# Patient Record
Sex: Male | Born: 1945 | Race: White | Hispanic: No | Marital: Married | Smoking: Former smoker
Health system: Southern US, Community
[De-identification: ages and names within clinical notes are randomized; demographics above are authoritative.]

## PROBLEM LIST (undated history)

## (undated) DIAGNOSIS — D649 Anemia, unspecified: Secondary | ICD-10-CM

## (undated) DIAGNOSIS — N183 Chronic kidney disease, stage 3 (moderate): Secondary | ICD-10-CM

## (undated) DIAGNOSIS — D531 Other megaloblastic anemias, not elsewhere classified: Secondary | ICD-10-CM

## (undated) DIAGNOSIS — D580 Hereditary spherocytosis: Secondary | ICD-10-CM

## (undated) HISTORY — DX: Other megaloblastic anemias, not elsewhere classified: D53.1

## (undated) HISTORY — DX: Chronic kidney disease, stage 3 (moderate): N18.3

## (undated) HISTORY — DX: Hereditary spherocytosis: D58.0

## (undated) HISTORY — DX: Anemia, unspecified: D64.9

---

## 2001-03-01 ENCOUNTER — Inpatient Hospital Stay (HOSPITAL_COMMUNITY): Admission: AD | Admit: 2001-03-01 | Discharge: 2001-03-03 | Payer: Self-pay | Admitting: Family Medicine

## 2001-03-01 ENCOUNTER — Encounter: Payer: Self-pay | Admitting: Family Medicine

## 2001-03-02 ENCOUNTER — Encounter: Payer: Self-pay | Admitting: Family Medicine

## 2001-03-10 ENCOUNTER — Encounter: Admission: RE | Admit: 2001-03-10 | Discharge: 2001-03-10 | Payer: Self-pay | Admitting: Family Medicine

## 2001-04-01 ENCOUNTER — Encounter: Payer: Self-pay | Admitting: Family Medicine

## 2001-04-01 ENCOUNTER — Ambulatory Visit (HOSPITAL_COMMUNITY): Admission: RE | Admit: 2001-04-01 | Discharge: 2001-04-01 | Payer: Self-pay | Admitting: Family Medicine

## 2001-04-26 ENCOUNTER — Other Ambulatory Visit: Admission: RE | Admit: 2001-04-26 | Discharge: 2001-04-26 | Payer: Self-pay | Admitting: Radiology

## 2005-01-07 ENCOUNTER — Ambulatory Visit: Payer: Self-pay | Admitting: Cardiology

## 2005-01-08 ENCOUNTER — Inpatient Hospital Stay (HOSPITAL_COMMUNITY): Admission: EM | Admit: 2005-01-08 | Discharge: 2005-01-10 | Payer: Self-pay | Admitting: Emergency Medicine

## 2005-01-08 ENCOUNTER — Encounter: Payer: Self-pay | Admitting: Cardiology

## 2007-09-15 ENCOUNTER — Encounter (INDEPENDENT_AMBULATORY_CARE_PROVIDER_SITE_OTHER): Payer: Self-pay | Admitting: General Surgery

## 2007-09-15 ENCOUNTER — Ambulatory Visit (HOSPITAL_COMMUNITY): Admission: RE | Admit: 2007-09-15 | Discharge: 2007-09-16 | Payer: Self-pay | Admitting: General Surgery

## 2011-03-27 ENCOUNTER — Ambulatory Visit (HOSPITAL_COMMUNITY)
Admission: RE | Admit: 2011-03-27 | Discharge: 2011-03-27 | Disposition: A | Discharge status: Home / Self Care | Payer: Medicare Other | Source: Ambulatory Visit | Attending: Cardiology | Admitting: Cardiology

## 2011-03-27 DIAGNOSIS — I209 Angina pectoris, unspecified: Secondary | ICD-10-CM | POA: Insufficient documentation

## 2011-03-27 DIAGNOSIS — I2510 Atherosclerotic heart disease of native coronary artery without angina pectoris: Secondary | ICD-10-CM | POA: Insufficient documentation

## 2011-03-27 LAB — GLUCOSE, CAPILLARY
Glucose-Capillary: 281 mg/dL — ABNORMAL HIGH (ref 70–99)
Glucose-Capillary: 291 mg/dL — ABNORMAL HIGH (ref 70–99)
Glucose-Capillary: 321 mg/dL — ABNORMAL HIGH (ref 70–99)

## 2011-04-07 ENCOUNTER — Other Ambulatory Visit (INDEPENDENT_AMBULATORY_CARE_PROVIDER_SITE_OTHER): Payer: Medicare Other

## 2011-04-07 DIAGNOSIS — R0989 Other specified symptoms and signs involving the circulatory and respiratory systems: Secondary | ICD-10-CM

## 2011-04-08 NOTE — Op Note (Signed)
NAME:  Mario Foster, Mario Foster NO.:  [id number]   MEDICAL RECORD NO.:  [id number]          PATIENT TYPE:  OIB   LOCATION:  5735                         FACILITY:  [facility]   PHYSICIAN:  Cherylynn Ridges, M.D.    DATE OF BIRTH:  17-May-1946   DATE OF PROCEDURE:  09/15/2007  DATE OF DISCHARGE:  09/16/2007                               OPERATIVE REPORT   PREOP DIAGNOSIS:  Symptomatic cholelithiasis with a history of  choledocholithiasis.   POSTOP DIAGNOSIS:  Symptomatic cholelithiasis with a history of  choledocholithiasis.   PROCEDURE:  Laparoscopic cholecystectomy with cholangiogram.   SURGEON:  Cherylynn Ridges, M.D.   ASSISTANT:  None.   ANESTHESIA:  General endotracheal.   ESTIMATED BLOOD LOSS:  Less than 30 mL.   COMPLICATIONS:  None.   CONDITION:  Stable.   FINDINGS:  The patient had a gallbladder packed with pigmented stones.  The cholangiogram was normal.  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   INDICATIONS FOR OPERATION:  The patient is a 65 year old whom I saw my  office on August 24, 2007 with a history of symptomatic gallstones,  but with jaundice with an bilirubin up to about 19.  He was not  hospitalized, but subsequent ultrasound demonstrated multiple gallstones  in the gallbladder, and a cavernous hemangioma of the liver.  He was  brought to surgery because of continued symptoms.   OPERATION:  The patient was taken to the operating room, and placed on  the table in the supine position.  After an adequate general  endotracheal anesthetic was administered, he was prepped and draped in  the usual sterile manner exposing the midline and the right upper  quadrant.   A supraumbilical curvilinear incision was made using a #11 blade and  taken down to the midline fascia.  We were able to split the midline  fascia with the [employer] retractors and place along the midline, and  then grab the edges with a Kocher clamp.  As we pulled up on the Kocher  clamps, we  bluntly dissected down into the peritoneal cavity with Kelly  clamps.  With this being done, a pursestring suture of #0 Vicryl was  passed on the fascial opening, and then a Hassan cannula passed through  the fascial opening into the peritoneal cavity with minimal difficulty.  It was secured in place with the pursestring suture of #0 Vicryl.   Once this was done, carbon dioxide insufflation was instilled into the  peritoneal cavity up to maximal intra-abdominal pressure of 15 mmHg.  Two right upper quadrant 5 mm cannulas and a subxiphoid 12 mm cannula  was passed under direct vision.  Once they were all in place, the  patient was placed in reverse Trendelenburg and the left-side was tilted  down.   The gallbladder was tense from the beginning and impacted with stones.  We were able to grab it at the dome and retract it towards the right  upper quadrant and the anterior abdominal wall.  We then placed a second  grasper on the infundibulum, and then dissected out the peritoneum  overlying the triangle of Fallot, and  hepatoduodenal triangle.  This  allowed [place] to dissect out the cystic duct and the cystic artery both of  which were clipped and cauterized.  We clipped the cystic duct along the  gallbladder side, then made a cholecystodochotomy.  It was through this  cholecystodochotomy that a cholangiogram was performed through a Cook  catheter which had been passed through the anterior abdominal wall.  This showed good flow into the duodenum, no proximal filling defects, no  dilatation of the common duct, and normal duodenum and good proximal  flow.   As mentioned, before, we did not cauterize the cystic duct.  We only  cauterized the cystic artery, and then clipped it and transected the  cystic artery. Once the cholangiogram was completed, we removed the  clips securing the catheter in place, removed the catheter, and then  triply clipped the distal cystic duct and transected the cystic  duct.  The rest of the procedure was spent dissecting out the gallbladder from  its chronically inflamed gallbladder bed.  We obtained hemostasis with  electrocautery then retrieved the gallbladder from the supraumbilical  site, having to open the gallbladder and retrieve a large amount of very  small pigmented stones from the gallbladder as we did so.   At no point did we notice a large tumor or mass in the liver itself.  The cavernous hemangioma noted on previous CT scan, it was not noted  intraoperatively.   Once the gallbladder was removed we irrigated the gallbladder fossa with  saline, and controlled hemostasis with electrocautery.  We washed the  subfascial plane with saline to remove all pieces of stone, pigmented  stones, which were in place.  Once this was done, we aspirated all fluid  and gas from above the liver, removed all cannulas and then closed.   The supraumbilical site was closed with a pursestring suture which was  in place, and we reinforced it with a single stitch of #0 Vicryl stitch.  We then injected 1/4% Marcaine at all sites, and then closed the skin at  the subxiphoid and supraumbilical site with running subcuticular 4-0  Vicryl.  We closed the lateral cannula sites with Dermabond, and then  applied Dermabond to the skin at the subxiphoid and the supraumbilical  site.  Once this was done, Steri-Strips and Tegaderm were applied.  All  needle counts, sponge counts, and instrument counts were correct.      Cherylynn Ridges, M.D.  Electronically Signed     JOW/MEDQ  D:  09/15/2007  T:  09/16/2007  Job:  [id number]   cc:   Petra Kuba, M.D.  Windle Guard, M.D.

## 2011-04-11 NOTE — Discharge Summary (Signed)
. [facility]  Patient:    Mario Foster, Mario Foster                     MRN: [id number] Adm. Date:  [id number] Disc. Date: 03/03/01 Attending:  Sanjuana Letters Dictator:   Gwenlyn Perking, M.D. CC:         Dr. Jeannetta Nap at [facility]             Urgent [facility]             Leighton Roach. Truett Perna, M.D.                           Discharge Summary  ADMISSION DIAGNOSES: 1. Anemia. 2. Jaundice. 3. Type 2 diabetes mellitus. 4. Hypertension. 5. Splenomegaly. 6. Left upper lobe density on chest x-ray, questionable pneumonia. 7. Hyponatremia.  DISCHARGE DIAGNOSES: 1. 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 2. Stress felt to cause #1 if a pneumonia. 3. Type 2 diabetes mellitus. 4. Hypertension. 5. Hyponatremia.  SERVICE:  Conservation officer, historic buildings.  MEDICAL STUDENT:  Orlene Plum.  REFERRING FACILITY/PHYSICIAN:  Drs. Jeannetta Nap with [facility], as well as Urgent Care.  CONSULTATIONS:  Hematology, Dr. Truett Perna.  PROCEDURES:  CT scan of chest, abdomen, and pelvis on 03/02/01.  HISTORY AND PHYSICAL EXAMINATION:  See admission H&P.  HOSPITAL COURSE:  The patient is a 65 year old white male who was admitted on 03/01/01, on advice of a physician at Urgent Care, with essentially fairly impressive splenomegaly, anemia, and jaundice.  The patient related to [place] that familial spirochetosis ran in his family, and it was felt that there was a fairly high probability that he may suffer from the same affliction.  On presentation, the patient was also found to be mildly hyponatremic at 126 sodium.  In addition, his BUN and creatinine were slightly elevated at 42 and 1.5.  Total bilirubin was up at 2.9.  His hemoglobin was 6.1, hematocrit 16.4, MCHC 37.1, MCB 82.6, and RDW 19.9.  A chest x-ray also revealed a questionable density in his left upper lung, and also splenomegaly.   Given the above presentation and the fairly impressive anemia, the patient was transfused 2 units of packed red blood cells which the patient tolerated well.  He almost immediately felt better with this intervention.  His bilirubin also trended down.  On hospital day #2, on 03/02/01, hematology, Dr. Truett Perna, was consulted and suggested osmotic fragility testing on his red blood cells, as well as ______ guillotines to rule out a secondary cause for his secrestation crisis. Dr. Truett Perna also suggested that we empirically treated this patient for a pneumonia that was revealed by an infiltrate on the CT scan of his chest which was also obtained on 03/02/01.  With hydration alone, the patients renal function improved, and overall, although the lung findings described above, the CT abdomen and pelvis could not demonstrate any abnormal findings except the fairly impressive splenomegaly.  The patient also received while hospitalized empiric treatment with azithromycin as well as ceftriaxone for this presumptive community acquired pneumonia.  His CBGs were under fair control, which is not surprising given this fairly recent stress and stress response.  He was treated with a sliding scale insulin while here in the hospital, and his blood glucose levels were in the 200 to 300s.  On 03/03/01, it was decided that the patient had benefited maximally from this  hospital admission, and could be discharged home safely.  DISCHARGE LABORATORY DATA:  Hemoglobin 7.0, hematocrit 19.3, BUN and creatinine 21 and 1.0, total bilirubin was down 1.8 from admission 2.9 on admission (this was mainly indirect bilirubin which is consistent with a hemolytic type picture).  The patient requested to be discharged, and on 03/03/01, he was discharged after appropriate followup with his primary care physician, Dr. Jeannetta Nap, as well as his hematologist, Dr. Truett Perna, had been arranged for him.  CONDITION ON DISCHARGE:   Stable.  DISPOSITION:  Discharged the patient to home.  DISCHARGE MEDICATIONS: 1. Doxycycline one p.o. b.i.d. x 2 weeks. 2. Diabinese 250 mg one p.o. b.i.d. before meals. 3. Glucophage 500 mg one p.o. b.i.d. before meals. 4. Enalapril 20 mg one p.o. q.d. 5. Folate 1 mg one p.o. q.d.  ACTIVITY:  Light activities.  No work for one week, then to gradually increase activity level.  DIET:  Diabetic diet, low fat.  FOLLOWUP APPOINTMENTS: 1. Dr. Jeannetta Nap on 03/05/01 at 11 a.m. to check on diabetes, as well as his    overall state, including his anemia. 2. The patient is instructed to call Dr. Danielle Dess office for an appointment    at [phone].  Appointment is to be made in 2 weeks or so. DD:  03/03/01 TD:  03/03/01 Job: [id number]

## 2011-04-11 NOTE — H&P (Signed)
NAME:  Mario Foster, APPLEGATE NO.:  [id number]   MEDICAL RECORD NO.:  [id number]          PATIENT TYPE:  EMS   LOCATION:  MAJO                         FACILITY:  [facility]   PHYSICIAN:  [facility] ServiceDATE OF BIRTH:  [birth year]   DATE OF ADMISSION:  01/08/2005  DATE OF DISCHARGE:                                HISTORY & PHYSICAL   CHIEF COMPLAINT:  Syncopal episode.   HISTORY OF PRESENT ILLNESS:  The patient is a 65 year old white male with  history of insulin-dependent diabetes and hypertension in he diagnosis of  hereditary _____spherocytosis_____ who felt faint at work today.  Came home,  took his insulin and then felt worse.  He took a nap, got up from his nap,  fell and hit his nose and had a nose bleed.  He was brought to the emergency  room for further evaluation.  He had no prodromal signs before passing out.  It was witnessed by his wife, lasted less than 2 minutes.  He did not have  any chest pain or shortness of breath.  No visual changes.  No other  precipitating factors that he can recall other than he was at work today.  He is a Corporate investment banker.  He had gotten up after urinating and had also  given himself the insulin as noted.  He had felt nauseated and had some  emesis around 2 p.m. this afternoon as well and checked his blood sugar.  It  was at 266.  That is when he gave himself his insulin.  Shortly after that  is when he went to the restroom and fell, hitting his nose on the side of a  table.  He did, as noted, take a nap, but did not feel better after waking  up and was brought in for further evaluation.   PAST MEDICAL HISTORY:  1.  Diagnosis of hereditary ____spherocytosis______ with anemia in 2002.  2.  Hypertension.  3.  Insulin-dependent diabetes.  4.  Osteoarthritis.   MEDICATIONS:  1.  Vasotec 20 mg p.o. daily.  2.  Metformin 1000 mg p.o. b.i.d.  3.  Ibuprofen up to four tablets per day.  4.  Niacin.  5.  Baby aspirin p.o.  daily.  6.  Regular insulin.  He believes it is 60 units, not sure.   ALLERGIES:  No known drug allergies.   SOCIAL HISTORY:  Does drink occasionally.  Quit smoking several years ago.  He works as a Corporate investment banker.  He is married, has two grown daughters.   FAMILY HISTORY:  Mom had a stroke.  Father had an MI.  No history of anemia  that he is aware of.   REVIEW OF SYSTEMS:  As per the HPI.  Negative for chest pain, headache,  blurred vision.  Positive for weakness, nausea, vomiting.   PHYSICAL EXAMINATION:  GENERAL APPEARANCE:  Alert and oriented to person,  place and time.  Answers questions appropriately.  Is in no acute distress.  VITAL SIGNS:  Temperature 100.1, blood pressure 137/61 lying, pulse 92,  respirations 18, O2 saturation 95% on room air.  HEENT:  Oropharynx is  clear.  Mucous membranes moist.  Pupils are equal and  reactive.  NECK:  Supple.  LUNGS:  Clear to auscultation except for some mild bibasilar rales.  HEART:  Rate is regular with normal S1, S2.  ABDOMEN:  Obese, soft, nontender with active bowel sounds.  No peripheral  edema.  EXTREMITIES:  Warm.  Cranial nerves II-XII are grossly intact.   LABORATORY DATA:  Sodium 138, potassium 4.2, chloride 106, CO2 25, glucose  231, BUN 25, creatinine 1.2.  Albumin, AST, ALT and alk-phos are normal.  INR 1.2.  White count 20,400, hemoglobin 13.4, platelets 187,000.  Absolute  neutrophil count of 60%, 35% bands.  Head CT was negative for any acute  intracranial abnormalities.  He does have a fracture of the maxillary spine.   ASSESSMENT:  A 65 year old gentleman with history of diabetes and  hypertension, now presents with a syncopal episode but also with  leukocytosis, fever and nonfocal neurological exam.  The patient has  multiple risk factors for syncope including but not limited to cardiac,  neurogenic or vasovagal etiologies such as post micturition, his insulin  administration.  His EKG was normal with  normal sinus rhythm.  No acute  abnormalities were noted there.  Also, he did mention that he takes iron as  well, and he does not appear to be anemic at this time.  His white count is  elevated, however.   IMPRESSION:  Syncopal episode may or may not be related to a possible  pneumonia or other source of infection.  We will check a UA and a chest x-  ray for cardiac or neurological etiologies of his syncope which had carotid  Dopplers and a 2D echocardiogram for vasovagal.  We will check orthostatics  as well.  Place him a sliding scale insulin and restart his ACE inhibitor  and his baby aspirin. His bleeding from his nose has stopped, so we can  proceed with that as well.  Hold his Niacin for now.  Empiric antibiotics  with Avelox as well.  Check hemoglobin A1C and TSH and serial cardiac  markers x3 as well.  GI prophylaxis with Protonix.  Tylenol for headache and  his osteoarthritis.  Follow up with his labs and other results.      RLK/MEDQ  D:  01/08/2005  T:  01/08/2005  Job:  [id number]

## 2011-04-11 NOTE — Discharge Summary (Signed)
NAME:  Mario Foster, Mario Foster NO.:  [id number]   MEDICAL RECORD NO.:  [id number]          PATIENT TYPE:  INP   LOCATION:  6529                         FACILITY:  [facility]   PHYSICIAN:  Michaelyn Barter, M.D. DATE OF BIRTH:  1946/10/09   DATE OF ADMISSION:  01/08/2005  DATE OF DISCHARGE:  01/10/2005                                 DISCHARGE SUMMARY   FINAL DIAGNOSES AT THE TIME OF DISCHARGE:  1.  Syncope.  2.  Fractured nose.  3.  Diabetes mellitus, uncontrolled.  4.  Hypertension, uncontrolled.   SECONDARY DIAGNOSIS:  Hereditary spherocytosis.   HISTORY OF PRESENT ILLNESS:  Mr. Mario Foster is a 65 year old gentleman who  arrived at the emergency room with a chief complaint of syncopal episode.  He has also got a past medical history of hereditary spherocytosis,  hypertension, and insulin-dependent diabetes mellitus.  He stated he was at  work earlier in the day when he felt faint.  He came home, took his insulin,  and felt worse.  He stated that he took a nap.  He awakened, fell and hit  his nose, and had a nosebleed secondary to the event.  He was brought to the  emergency room for further evaluation.  He denied any prodromal signs.  His  wife, who witnessed the episode, stated that it lasts for approximately 2  minutes.  He denied any chest pain or shortness of breath.  He stated that  he had gotten up after urinating, and had also given himself insulin.  He  felt nauseated and had an episode of emesis around 2 p.m.  He checked his  sugar and found it to be 266.  He gave himself some insulin, and shortly  afterwards he went to the restroom and fell.   PAST MEDICAL HISTORY:  1.  Diagnosis of hereditary spherocytosis.  2.  Hypertension.  3.  Insulin-dependent diabetes.  4.  Osteoarthritis.   ALLERGIES:  No known drug allergies.   SOCIAL HISTORY:  Cigarettes - the patient stopped smoking several years ago.  Alcohol - the patient drinks occasionally.   FAMILY  HISTORY:  Mother had a stroke.  Father had an MI.   HOSPITAL COURSE:  1.  Syncope.  The patient was admitted into the hospital for further      evaluation of his syncope.  He underwent a CT scan of his head, which      was interpreted on January 07, 2005 as no acute intracranial      abnormalities.  Mild periventricular white matter hypodensity, which may      be seen due to remote microvascular small vessel ischemic changes.  In      addition, the patient also had a carotid artery study completed on      January 08, 2005, which revealed mild plaque throughout bilaterally.      The right common carotid had 40% to 60% internal carotid artery      stenosis.  The left had 40% to 60% internal carotid artery stenosis.      There was anterograde vertebral artery flow bilaterally.  In addition,  the patient also had a 2-D echocardiogram completed on January 08, 2005, the final results of which were grade 1 diastolic dysfunction.      Left ventricular size was at the upper limits of normal.  Overall left      ventricular systolic function was normal.  Left ventricular ejection      fraction was estimated to range between 55% to 65%.  The study was      inadequate for the evaluation of left ventricular regional wall motion.      Left ventricular wall thickness was mildly increased.  Aortic valve not      well seen.  There looked to be a probable mild aortic regurgitation, but      this is somewhat difficult to evaluate.  Over the course of his      hospitalization, he had no new complaints, and by the second day of his      admission, he stated that he felt back to his baseline, and stated that      he was ready to be discharged home.  2.  Fractured nose.  Again, the patient had a CT scan completed at the time      of his admission, and it was interpreted as fracture of the anterior      maxillary spine.  Otherwise, negative maxillofacial CT.  The radiologist      also stated that  there are no other fractures and there are no sinus air      fluid levels.  There is no intraorbital emphysema.  3.  Diabetes mellitus.  The patient's Accu-Cheks were followed over the      course of his hospitalization.  His sugars remained slightly elevated      over the course of his hospitalization.  It was believed that the      patient could follow with his primary care physician for further control      of his blood sugars.  4.  Bilateral carotid artery stenosis.  This was confirmed on the carotid      ultrasound.  The decision was made that the patient could have this      monitored over the course of time, and if it progressed then he would      have to pursue further work-up.   CONDITION AT THE TIME OF DISCHARGE:  On the date of discharge, the patient  stated that he felt back to his baseline, and requests to be discharged  home.   VITAL SIGNS AT THE TIME OF DISCHARGE.:  temperature of 98.4, blood pressure  130s to 140s systolically over the 50s.  Heart rate in the 60, and he was  breathing in the 20s.   The decision was made to discharge the patient home.  He was discharged home  on the following medications.   DISCHARGE MEDICATIONS:  1.  Vasotec 20 mg one tablet daily for blood pressure.  2.  Moxifloxacin 500 mg one tablet daily.  In addition, the patient also was      started on the moxifloxacin empirically for a suspected bacterial      infection at the time of admission, although the patient did not      demonstrate any signs of infection over the course of his      hospitalization.  3.  The patient was also instructed to continue all of his previously-      prescribed home medications.   In  addition, he had cardiac enzymes ordered, the results of which were  normal, and on the date of discharge his troponin I was 0.01, and CK-MB was  1.4.  Therefore, the decision was made to send the patient home.  DISCHARGE INSTRUCTIONS:  The patient was to eat at least 3 meals  per day and  drink fluids.  He was told to see his primary care doctor within 1-2 weeks  for follow up evaluation, and to take all of his medications as prescribed.      OR/MEDQ  D:  03/30/2005  T:  03/31/2005  Job:  [id number]   cc:   Windle Guard, M.D.  [address]  [place], [place] [address]  Fax: [phone]

## 2011-04-11 NOTE — H&P (Signed)
[facility]  Patient:    Mario Foster, Mario Foster                     MRN: [id number] Adm. Date:  [id number] Attending:  Sanjuana Letters Dictator:   Lorette Ang, N.P. CC:         Santiago Bumpers Leveda Anna, M.D.  Hadassah Pais. Jeannetta Nap, M.D.  [facility]   History and Physical  DATE OF BIRTH:  September 29, 1946.  DATE OF CONSULT:  March 03, 2001.  REASON FOR CONSULTATION:  Anemia.  REFERRING PHYSICIAN:  Santiago Bumpers. Leveda Anna, M.D.  HISTORY OF PRESENT ILLNESS:  Mr. [name] is a 65 year old male with a history of hypertension and type 2 diabetes mellitus who began experiencing generalized body aches and shaking chills on February 26, 2001. These symptoms persisted over the next several days with the patient presenting to [facility] on March 01, 2001 for evaluation. Labs at the urgent medical care center demonstrated significant anemia with a hemoglobin of 6.2, WBC 15.3, platelet count 237,000 and a blood sugar of greater than 400. The patient was transferred to [facility] for further evaluation. The anemia was confirmed with repeat labs revealing a hemoglobin of 6.1. The patient was also noted to be jaundiced and found to have splenomegaly on exam. He has been transfused 2 units of packed red blood cells. A hematology consult has been requested for further evaluation of anemia with a possible family history consistent with hereditary spherocytosis.  PAST MEDICAL HISTORY: 1. Diabetes mellitus type 2. 2. Hypertension.  CURRENT MEDICATIONS: 1. Vasotec 20 mg p.o.q.d. 2. Metformin 1000 mg p.o. b.i.d. 3. Diabinese 250 mg p.o. b.i.d. 4. Rocephin 1 g q.24h.  HOME MEDICATIONS: 1. Glucophage 1000 mg p.o. b.i.d. 2. Diabinese. 3. Inderal. *The patient denies any recent new medications.  ALLERGIES:  No known drug allergies.  FAMILY HISTORY:  Mother deceased at age 34 with a CVA; father deceased at age 64 with an MI; brother with  diabetes mellitus. Patient reports that his maternal grandmother is deceased with "an enlarged spleen." He also reports that a maternal aunt underwent splenectomy.  SOCIAL HISTORY:  The patient lives in [place] with his spouse. They have two children, [name] age 15 and [name] age 50. The patient reports that [name] has been evaluated in the past, approximately 10-12 years ago by Dr. Ladona Horns. Neijstrom for anemia. He is unsure of Dr. Arnell Asal findings at that time. Mr. Groeneveld is employed in [employer]. He does have a positive tobacco history reporting that he quit smoking 20 years ago at 1 pack per day for more than 30 years. He currently uses smokeless tobacco. He denies any ETOH.  REVIEW OF SYSTEMS:  The patient denies any weight loss. He has been experiencing chills and sweats over the past several days, but has had no documented fever at home. He reports the generalized body aches onset on February 26, 2001. His energy level has been decreased and he states that he feels weak. He denies any unusual headaches or vision changes. He has had no mouth sores or neck pain. He denies any shortness of breath but report a recent cough. He denies any hemoptysis. He has had no chest pain or peripheral edema. He denies any recent change in his bowel habits. He has had no rectal bleeding or melena. He does report recent polyuria as well as "dark urine." He denies noticing any jaundice at home. He does report that  his wife and daughter were sick recently with urinary tract infections.  PHYSICAL EXAMINATION:  VITAL SIGNS:  Temperature 98.6, heart rate 112, respirations 20, blood pressure 140/74.  GENERAL:  Well nourished Caucasian male in in no acute distress.  HEENT:  Normocephalic and atraumatic. Pupils equal, round, reactive to light. Extraocular movements intact. Slightly scleral icterus. Oropharynx is clear. Full upper and lower dentures.  LYMPHATICS:  No palpable lymph  nodes.  LUNGS:  Clear bilaterally. No wheezes or rales.  CARDIOVASCULAR:  Regular, tachycardic. 2/6 systolic ejection murmur.  ABDOMEN:  Soft and nontender. Bowel sounds active. Spleen is palpable, no hepatomegaly.  EXTREMITIES:  No edema.  NEUROLOGIC:  Alert and oriented x 3. Follows commands. Muscle strength is 5/5.  SKIN:  Warm and diaphoretic.  LABORATORY AND ACCESSORY DATA:  Hemoglobin 7.4, white blood cell count 9.2, platelet count 250,000. Sodium 131, potassium 4.0, BUN 29.0, creatinine 1.1, glucose 402, calcium 8.5. Ferritin 1370, iron 48, TIBC 217, percent saturation 22, reticulocyte 9.1%, RBC 2.00, absolute reticulocyte 182. Haptoglobin is less than 6. LDH normal.  ADMISSION LABORATORY DATA:  Hemoglobin 6.1, white blood cell count 12.9, platelet count 280,000, MCV 82.6, MCHC 37.1. Sodium 126, potassium 4.3, BUN 42.0, creatinine 1.5, glucose 460. Total bilirubin 2.9, alkaline phosphatase 53, SGOT 16, SGPT 17, total protein 7.0, albumin 3.7, calcium 8.9.  Peripheral smear:  Increased polychromasia, few spherocytes, rare teardrops and few burr cells.  CT of chest on March 02, 2001:  Air space disease seen within the lateral portion of the left upper lobe with a rounded density (question rounded infiltrate) within the superior segment of the right lower lobe and also ill-defined patchy infiltrate within the medial portion of the right lower lobe. No evidence for mediastinal or hilar lymphadenopathy.  CT of the abdomen on March 02, 2001:  Marked splenomegaly. No focal splenic abnormalities. The liver and pancreas have a normal appearance. Multiple small gallstones seen within the gallbladder with a normal gallbladder wall thickness. No evidence for a retroperitoneal lymphadenopathy or ascites.  CT of the pelvis March 02, 2001:  No evidence of pelvic mass or adenopathy. No  free pelvic fluid.  IMPRESSION:  [name] is a 65 year old man who reports a history  of "anemia" who was admitted with a febrile illness and found to be severe anemic. Upon chest and abdominal x-ray, the patient was found to have a likely pneumonia as well as markedly splenomegaly.  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 He gives a family history consistent with hereditary spherocytosis and the blood smear, negative VAT, and splenomegaly are consistent with this diagnosis. The hemoglobin should return to baseline with treatment of the infection. There is no specific treatment for hereditary spherocytosis, but splenectomy will increase the hemoglobin in symptomatic patients. There is no evidence for intravascular hemolysis.  RECOMMENDATIONS: 1. Continue supportive care, antibiotics for presumed pneumonia. 2. Obtain old records from Dr. Windle Guard for a baseline    hemoglobin and previous workup. 3. Continue folic acid. 4. Hold further transfusions unless he develops symptoms from the    anemia. Limit lab draws as possible. 5. Check a cold agglutinin screen. 6. Osmotic fragility test to confirm the diagnosis of hereditary    spherocytosis. 7. We will continue to follow Mr. Viviann Spare as an inpatient and then we will set him up for outpatient follow-up.  The patient is seen and examined by Dr. Truett Perna. DD:  03/03/01 TD:  03/03/01 Job: [id number]

## 2011-04-14 NOTE — Procedures (Unsigned)
CAROTID DUPLEX EXAM  INDICATION:  Carotid bruit.  HISTORY: Diabetes:  Yes. Cardiac:  CAD. Hypertension:  Yes. Smoking:  Previous. Previous Surgery:  Heart catheterization, May 2012. CV History:  Asymptomatic. Amaurosis Fugax No, Paresthesias No, Hemiparesis No.                                      RIGHT             LEFT Brachial systolic pressure:         148               138 Brachial Doppler waveforms:         WNL               WNL Vertebral direction of flow:        Antegrade         Antegrade DUPLEX VELOCITIES (cm/sec) CCA peak systolic                   124               151 ECA peak systolic                   177               165 ICA peak systolic                   88                150 ICA end diastolic                   20                24 PLAQUE MORPHOLOGY:                  Heterogenous      Heterogenous PLAQUE AMOUNT:                      Mild              Mild PLAQUE LOCATION:                    CCA, ICA          CCA, ICA  IMPRESSION: 1. Bilateral internal carotid artery stenosis in the 1% to 39% range. 2. Bilateral external carotid artery stenosis present. 3. Bilateral vertebral arteries appear patent and antegrade.  ___________________________________________ V. Charlena Cross, MD  SH/MEDQ  D:  04/07/2011  T:  04/08/2011  Job:  [id number]

## 2011-04-14 NOTE — Cardiovascular Report (Signed)
NAME:  Mario Foster, Mario Foster NO.:  [id number]  MEDICAL RECORD NO.:  [id number]           PATIENT TYPE:  O  LOCATION:  [facility]                         FACILITY:  [facility]  PHYSICIAN:  Georga Hacking, M.D.DATE OF BIRTH:  June 03, 1946  DATE OF PROCEDURE:  03/27/2011                           CARDIAC CATHETERIZATION   HISTORY:  This is a 65 year old male with history of insulin-dependent diabetes mellitus, uncontrolled hypertension, and low HDL cholesterol who presented with a 2-year history of exertional angina that was limiting.  He had a prolonged episode of pain lasting over 24 hours.  He is brought to the catheterization laboratory for elective catheterization to evaluate for coronary artery disease.  PROCEDURE:  Left heart catheterization with coronary angiograms and left ventriculogram.  PROCEDURE IN DETAIL:  The patient was brought to the catheterization laboratory and was prepped and draped in the usual manner.  After Xylocaine anesthesia, a 5-French sheath was placed in the right femoral artery percutaneously.  Angiograms were made using 5-French catheters and a 30-mL ventriculogram was performed.  The digital images were reviewed on the table, and decision was made to try medical therapy initially.  He was given labetalol 20 mg IV because of persistent hypertension and taken to the holding area for sheath removal.  He tolerated the procedure well.  HEMODYNAMIC DATA:  Aorta postcontrast 153/62, LV postcontrast 153/13-21.  ANGIOGRAPHIC DATA:  Left ventriculogram:  Performed in the 30-degree RAO projection.  The aortic valve is normal.  Mitral valve is normal.  Left ventricle is normal in size.  There is severe hypokinesis of the inferobasal segment.  Remaining segments contract normally and an estimated ejection fraction was 60%.  Coronary arteries arise and distribute normally.  The left coronary system is heavily calcified. The right coronary artery has  moderate calcification noted.  Left main coronary artery is normal.  Left anterior descending is heavily calcified, especially in the midportion.  There is moderate diffuse narrowing through the area of the calcification but no severe focal obstructive stenoses are noted.  The circumflex coronary artery is moderately calcified.  A small first marginal branch appears to be diffusely diseased.  There is calcification with moderate irregularity in the midportion of the vessel.  The right coronary artery is diffusely diseased proximally and has an associated catheter damping.  There is a subtotal 99% stenosis in the midportion and the vessel appears occluded distally, somewhat small in caliber.  There is collateral filling of the right coronary artery from the left coronary system.  IMPRESSION: 1. Coronary artery disease with subtotal occlusion of the mid right     coronary artery and occlusion of the distal right coronary artery     with left-to-right collaterals. 2. Significantly calcified left anterior descending with moderate    diffuse disease, mild disease in circumflex. 3. Preserved left ventricular function with inferior hypokinesis     consistent with a previous infarction.  RECOMMENDATIONS:  The patient will have an initial trial of medical therapy.  If he has limiting angina despite adequate medical therapy, he could be considered for a trial of percutaneous intervention although the right coronary is somewhat diffusely diseased and  appears to be less than optimal for PCI initially.     Georga Hacking, M.D.     WST/MEDQ  D:  03/27/2011  T:  03/27/2011  Job:  [id number]  cc:   Windle Guard, M.D.  Electronically Signed by Lacretia Nicks. Donnie Aho M.D. on 04/14/2011 01:19:37 AM

## 2011-09-03 LAB — DIFFERENTIAL
Eosinophils Absolute: 0.2
Eosinophils Relative: 2
Lymphocytes Relative: 16
Monocytes Absolute: 0.4
Neutro Abs: 7.9 — ABNORMAL HIGH

## 2011-09-03 LAB — COMPREHENSIVE METABOLIC PANEL
CO2: 26
Calcium: 9.2
Chloride: 102
GFR calc Af Amer: >60
GFR calc non Af Amer: >60
Total Bilirubin: 3.2 — ABNORMAL HIGH

## 2011-09-03 LAB — CBC
MCHC: 35.4
RDW: 20.9 — ABNORMAL HIGH

## 2012-04-14 ENCOUNTER — Other Ambulatory Visit: Payer: Self-pay | Admitting: Cardiology

## 2012-11-29 ENCOUNTER — Ambulatory Visit (HOSPITAL_BASED_OUTPATIENT_CLINIC_OR_DEPARTMENT_OTHER): Payer: Medicare Other | Attending: Family Medicine | Admitting: Radiology

## 2012-11-29 VITALS — Ht 71.0 in | Wt 230.0 lb

## 2012-11-29 DIAGNOSIS — G4733 Obstructive sleep apnea (adult) (pediatric): Secondary | ICD-10-CM

## 2012-11-29 DIAGNOSIS — G4710 Hypersomnia, unspecified: Secondary | ICD-10-CM | POA: Insufficient documentation

## 2012-12-04 DIAGNOSIS — G4730 Sleep apnea, unspecified: Secondary | ICD-10-CM

## 2012-12-04 DIAGNOSIS — G4710 Hypersomnia, unspecified: Secondary | ICD-10-CM

## 2012-12-04 DIAGNOSIS — R0989 Other specified symptoms and signs involving the circulatory and respiratory systems: Secondary | ICD-10-CM

## 2012-12-04 DIAGNOSIS — R0609 Other forms of dyspnea: Secondary | ICD-10-CM

## 2012-12-04 NOTE — Procedures (Signed)
NAME:  Mario Foster, Mario Foster NO.:  [id number]  MEDICAL RECORD NO.:  [id number]          PATIENT TYPE:  OUT  LOCATION:  [facility]                 FACILITY:  [facility]  PHYSICIAN:  Clinton D. Maple Hudson, MD, FCCP, FACPDATE OF BIRTH:  03[birth year]  DATE OF STUDY:  11/29/2012                           NOCTURNAL POLYSOMNOGRAM  REFERRING PHYSICIAN:  Windle Guard, M.D.  INDICATION FOR STUDY:  Hypersomnia with sleep apnea.  EPWORTH SLEEPINESS SCORE:  9/24.  BMI 32.1, weight 230 pounds, height 71 inches, neck 18 inches.  MEDICATIONS:  Home medications are charted and reviewed.  SLEEP ARCHITECTURE:  Total sleep time 199 minutes with sleep efficiency 53.9%.  Stage I was 22.6%, stage II 73.1%, stage III 4.3%, REM absent. Sleep latency 29.5 minutes, awake after sleep onset 139 minutes. Arousal index 27.1.  Bedtime medication:  None.  Sustained sleep was not achieved until shortly before midnight.  He was again awake for most of the time between 1:30 and 2:45 a.m. and then again after 3:30 a.m.  RESPIRATORY DATA:  Apnea-hypopnea index (AHI) 3.3 per hour.  A total of 11 events was scored including three obstructive apneas, 1 mixed apnea and 7 hypopneas.  Events were seen in all sleep positions, especially while supine.  There were not enough events to meet protocol criteria for CPAP titration.  OXYGEN DATA:  Moderate snoring with oxygen desaturation to a nadir of 81% and mean oxygen saturation through the study of 95.9% on room air.  CARDIAC DATA:  Sinus rhythm with PACs and PVCs.  MOVEMENT-PARASOMNIA:  A total of 16 limb jerks were counted, of which two were associated with arousals or awakenings for a periodic limb movement with arousal index of 0.6 per hour.  Bathroom x1.  IMPRESSIONS-RECOMMENDATIONS: 1. Significant difficulty initiating and maintaining sleep which may     respond best to treatment as insomnia. 2. Occasional respiratory event with sleep disturbance, within  normal     limits.  AHI 3.3 per hour (the normal range     for adults is from 0 to 5 events per hour).  Moderate snoring with     oxygen desaturation to a nadir of 81% and mean oxygen saturation     through the study of 95.9% on room air.     Clinton D. Maple Hudson, MD, Tonny Bollman, FACP Diplomate, American Board of Sleep Medicine    CDY/MEDQ  D:  12/04/2012 10:28:43  T:  12/04/2012 12:35:59  Job:  [id number]

## 2016-04-23 ENCOUNTER — Encounter: Payer: Self-pay | Admitting: Genetic Counselor

## 2016-05-30 ENCOUNTER — Encounter: Payer: Self-pay | Admitting: Oncology

## 2016-05-30 ENCOUNTER — Other Ambulatory Visit: Payer: Self-pay | Admitting: Oncology

## 2016-05-30 ENCOUNTER — Telehealth: Payer: Self-pay | Admitting: Genetic Counselor

## 2016-05-30 DIAGNOSIS — N183 Chronic kidney disease, stage 3 unspecified: Secondary | ICD-10-CM

## 2016-05-30 DIAGNOSIS — D649 Anemia, unspecified: Secondary | ICD-10-CM

## 2016-05-30 HISTORY — DX: Anemia, unspecified: D64.9

## 2016-05-30 HISTORY — DX: Chronic kidney disease, stage 3 unspecified: N18.30

## 2016-05-30 NOTE — Telephone Encounter (Signed)
I spoke with Mr. Mario Foster about his appointment. Discussed that he had been misscheduled into the cancer genetics clinic.  I have spoken with Tyler Aasoris at Dr. Patsy LagerGranfortuna's office, and she will call patient once Dr. Cyndie ChimeGranfortuna has reviewed the chart to provide him next steps.  Patient voiced understanding.  I apologized for the inconvenience.

## 2016-06-02 ENCOUNTER — Other Ambulatory Visit: Payer: Self-pay

## 2016-06-02 ENCOUNTER — Encounter: Payer: Self-pay | Admitting: Genetic Counselor

## 2016-06-11 ENCOUNTER — Other Ambulatory Visit: Payer: Self-pay | Admitting: Oncology

## 2016-06-11 DIAGNOSIS — N183 Chronic kidney disease, stage 3 unspecified: Secondary | ICD-10-CM

## 2016-06-11 DIAGNOSIS — D649 Anemia, unspecified: Secondary | ICD-10-CM

## 2016-06-16 ENCOUNTER — Other Ambulatory Visit (HOSPITAL_COMMUNITY)
Admission: RE | Admit: 2016-06-16 | Discharge: 2016-06-16 | Disposition: A | Discharge status: Home / Self Care | Payer: Medicare Other | Source: Ambulatory Visit | Attending: Oncology | Admitting: Oncology

## 2016-06-16 ENCOUNTER — Other Ambulatory Visit (INDEPENDENT_AMBULATORY_CARE_PROVIDER_SITE_OTHER): Payer: Medicare Other

## 2016-06-16 DIAGNOSIS — N183 Chronic kidney disease, stage 3 unspecified: Secondary | ICD-10-CM

## 2016-06-16 DIAGNOSIS — D649 Anemia, unspecified: Secondary | ICD-10-CM

## 2016-06-16 LAB — SAVE SMEAR

## 2016-06-16 NOTE — Addendum Note (Signed)
Addended by: Bufford Spikes on: 06/16/2016 10:27 AM   Modules accepted: Orders

## 2016-06-17 LAB — COMPREHENSIVE METABOLIC PANEL
A/G RATIO: 2 (ref 1.2–2.2)
ALT: 9 IU/L (ref 0–44)
AST: 14 IU/L (ref 0–40)
Albumin: 4.7 g/dL (ref 3.5–4.8)
Alkaline Phosphatase: 53 IU/L (ref 39–117)
BUN/Creatinine Ratio: 16 (ref 10–24)
BUN: 19 mg/dL (ref 8–27)
Bilirubin Total: 2 mg/dL — ABNORMAL HIGH (ref 0.0–1.2)
CALCIUM: 9 mg/dL (ref 8.6–10.2)
CO2: 19 mmol/L (ref 18–29)
CREATININE: 1.16 mg/dL (ref 0.76–1.27)
Chloride: 105 mmol/L (ref 96–106)
GFR, EST AFRICAN AMERICAN: 73 mL/min/{1.73_m2} (ref >59)
GFR, EST NON AFRICAN AMERICAN: 63 mL/min/{1.73_m2} (ref >59)
GLUCOSE: 137 mg/dL — AB (ref 65–99)
Globulin, Total: 2.3 g/dL (ref 1.5–4.5)
POTASSIUM: 4.9 mmol/L (ref 3.5–5.2)
Sodium: 140 mmol/L (ref 134–144)

## 2016-06-17 LAB — CBC WITH DIFFERENTIAL/PLATELET
BASOS ABS: 0 10*3/uL (ref 0.0–0.2)
Basos: 0 %
EOS (ABSOLUTE): 0.2 10*3/uL (ref 0.0–0.4)
EOS: 2 %
HEMATOCRIT: 29.4 % — AB (ref 37.5–51.0)
Hemoglobin: 10.1 g/dL — ABNORMAL LOW (ref 12.6–17.7)
IMMATURE GRANULOCYTES: 1 %
Immature Grans (Abs): 0.1 10*3/uL (ref 0.0–0.1)
LYMPHS ABS: 1.2 10*3/uL (ref 0.7–3.1)
Lymphs: 16 %
MCH: 30.6 pg (ref 26.6–33.0)
MCHC: 34.4 g/dL (ref 31.5–35.7)
MCV: 89 fL (ref 79–97)
MONOCYTES: 6 %
MONOS ABS: 0.5 10*3/uL (ref 0.1–0.9)
NEUTROS PCT: 75 %
Neutrophils Absolute: 5.9 10*3/uL (ref 1.4–7.0)
PLATELETS: 152 10*3/uL (ref 150–379)
RBC: 3.3 x10E6/uL — AB (ref 4.14–5.80)
RDW: 17.7 % — AB (ref 12.3–15.4)
WBC: 7.8 10*3/uL (ref 3.4–10.8)

## 2016-06-17 LAB — IMMUNOFIXATION ELECTROPHORESIS
IGA/IMMUNOGLOBULIN A, SERUM: 175 mg/dL (ref 61–437)
IGG (IMMUNOGLOBIN G), SERUM: 923 mg/dL (ref 700–1600)
IGM (IMMUNOGLOBULIN M), SRM: 111 mg/dL (ref 20–172)
Total Protein: 7 g/dL (ref 6.0–8.5)

## 2016-06-17 LAB — IRON AND TIBC
Iron Saturation: 42 % (ref 15–55)
Iron: 115 ug/dL (ref 38–169)
TIBC: 277 ug/dL (ref 250–450)
UIBC: 162 ug/dL (ref 111–343)

## 2016-06-17 LAB — FERRITIN: FERRITIN: 642 ng/mL — AB (ref 30–400)

## 2016-06-23 ENCOUNTER — Encounter (INDEPENDENT_AMBULATORY_CARE_PROVIDER_SITE_OTHER): Payer: Self-pay

## 2016-06-23 ENCOUNTER — Ambulatory Visit (INDEPENDENT_AMBULATORY_CARE_PROVIDER_SITE_OTHER): Payer: Medicare Other | Admitting: Oncology

## 2016-06-23 ENCOUNTER — Encounter: Payer: Self-pay | Admitting: Oncology

## 2016-06-23 VITALS — BP 143/43 | HR 56 | Temp 98.1°F | Ht 69.5 in | Wt 202.0 lb

## 2016-06-23 DIAGNOSIS — D589 Hereditary hemolytic anemia, unspecified: Secondary | ICD-10-CM | POA: Diagnosis not present

## 2016-06-23 DIAGNOSIS — D649 Anemia, unspecified: Secondary | ICD-10-CM

## 2016-06-23 DIAGNOSIS — D531 Other megaloblastic anemias, not elsewhere classified: Secondary | ICD-10-CM

## 2016-06-23 DIAGNOSIS — D6489 Other specified anemias: Secondary | ICD-10-CM

## 2016-06-23 LAB — SAVE SMEAR

## 2016-06-23 NOTE — Progress Notes (Signed)
New Patient Hematology   Mario Foster [id number] Mar 22, 1946 70 y.o. 06/23/2016  CC: Dr. 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 Hypertension. Degenerative but not inflammatory arthritis. No history of hepatitis, yellow jaundice, malaria, mononucleosis, or other liver disease. He was a heavy beer drinker in his younger days. Minimal alcohol for the last 3 years with an occasional beer or hard liquor. He gives a history of a familial blood disorder affecting his maternal grandmother and his daughter. He says that it has a long name which he cannot remember. 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 This was not confirmed by further study by his primary care physician. Lab forwarded from Dr. 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 He gets dyspnea on exertion but not at rest. He was evaluated 3 years ago for chest pain and told that he had a heart attack. He had a cardiac catheterization but has been treated medically. He has had no clinical bleeding and denies epistaxis, hematuria, or hematochezia. Stools are black from iron supplementation. He has had joint stiffness from early in the small joints of his hands. Also his knees left greater than right. Left knee tends to give out on him.   PMH: Past Medical History:  Diagnosis Date  . Chronic renal insufficiency, stage III (moderate) 05/30/2016  . Normochromic anemia 05/30/2016  Hypertension. Diabetes type 2. Now insulin-dependent. Hyperlipidemia. Mild  chronic renal insufficiency. Creatinines recorded up to 1.4. Remote renal stone. Degenerative arthritis. No thyroid disease. No history of seizure or stroke. Denies history of inflammatory arthritis or lupus.    Past surgical history:  Cholecystectomy   Allergies: No Known Allergies  Medications:  Current Outpatient Prescriptions:  .  amLODipine (NORVASC) 10 MG tablet, Take 10 mg by mouth daily., Disp: , Rfl:  .  atorvastatin (LIPITOR) 40 MG tablet, Take 40 mg by mouth daily., Disp: , Rfl:  .  chlorthalidone (HYGROTON) 25 MG tablet, Take 25 mg by mouth daily., Disp: , Rfl:  .  glimepiride (AMARYL) 4 MG tablet, Take 4 mg by mouth daily., Disp: , Rfl:  .  HUMULIN 70/30 (70-30) 100 UNIT/ML injection, Inject 95 Units into the skin., Disp: , Rfl:  .  lisinopril (PRINIVIL,ZESTRIL) 20 MG tablet, Take 10 mg by mouth daily., Disp: , Rfl:   Social History:  married. 2 daughters aged 65 and 59.  Retired Corporate investment banker. No known toxic or lead exposure.  reports that he has quit smoking many years ago.Marland Kitchen His smokeless tobacco use includes Chew. He does not dip snuff. Alcohol use minimal at this time. Occasional beer or hard liquor. Less than once per week.   Family History: See history of present illness. Mother lived until age 78 died of a probable ruptured cerebral aneurysm. Father died at age 21 of a heart attack. He has one brother age 28 who is also diabetic.    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  Remaining ROS negative.  Physical Exam: Blood  pressure (!) 143/43, pulse (!) 56, temperature 98.1 F (36.7 C), temperature source Oral, height 5' 9.5" (1.765 m), weight 202 lb (91.6 kg), SpO2 100 %. Wt Readings from Last 3 Encounters:  06/23/16 202 lb (91.6 kg)  11/29/12 230 lb (104.3 kg)     General appearance:  well nourished Caucasian man HENNT: Pharynx no erythema, exudate, mass, or ulcer. No thyromegaly or thyroid nodules Lymph nodes: No  cervical, supraclavicular, or axillary lymphadenopathy Breasts:  Lungs: Clear to auscultation, resonant to percussion throughout Heart: Regular rhythm, no murmur, no gallop, no rub, no click, no edema Abdomen: Soft, nontender, normal bowel sounds, no mass, no organomegaly Extremities: No edema, no calf tenderness Musculoskeletal: no joint deformities GU:  Vascular: Carotid pulses 2+, no bruits Neurologic: Alert, oriented, PERRLA,  I was unable to visualize the optic discs   vessels overall  normal, no hemorrhage or exudate, cranial nerves grossly normal, motor strength 5 over 5, reflexes 1+ symmetric, upper body coordination normal, gait normal, vibration sensation minimally decreased over the fingertips by tuning fork exam  Skin: No rash or ecchymosis; some telangiectasias on the skin of the nose    Lab Results: Lab Results  Component Value Date   WBC 7.8 06/16/2016   HGB 13.1 09/15/2007   HCT 29.4 (L) 06/16/2016   MCV 89 06/16/2016   PLT 152 06/16/2016     Chemistry      Component Value Date/Time   NA 140 06/16/2016 0857   K 4.9 06/16/2016 0857   CL 105 06/16/2016 0857   CO2 19 06/16/2016 0857   BUN 19 06/16/2016 0857   CREATININE 1.16 06/16/2016 0857      Component Value Date/Time   CALCIUM 9.0 06/16/2016 0857   ALKPHOS 53 06/16/2016 0857   AST 14 06/16/2016 0857   ALT 9 06/16/2016 0857   BILITOT 2.0 (H) 06/16/2016 0857      LDH 155, reticulocyte count 5.4% ,MCHC 34.4 (31.5-35.7) Serum immunoglobulins all normal IgG 923, A 175, M 111 mg%; no monoclonal proteins on immunofixation electrophoresis  Review of peripheral blood film: Pending:  Normochromic normocytic red cells. 1+ poikilocytosis. 1+ spherocytes. No polychromasia. No Red cell inclusions. No basophilic stippling. Mature neutrophils. No early myeloid cells. Platelets normal in number and morphology.   Radiological Studies: No results found.    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 Iron and ferritin elevated due to chronic hemolysis. Isolated elevation of bilirubin with normal liver functions, elevated reticulocyte count, peripheral blood film review, and his family history are consistent with this. I do not think the fact that he is a heterozygote for the minor hemochromatosis genes is a contributing factor to his elevated ferritin. In addition, anemia is not part of the presentation for hemochromatosis. Most patients have normal or slightly elevated hemoglobins.  Recommendation: I'm going to get an ultrasound of his abdomen to assess his liver and spleen. I'm checking a reticulocyte count. Check  LDH (see results above). I expect we will find splenomegaly. I checked the pathology report on his October 2008 cholecystectomy. He had gallstones but they were not reported as being bilirubin stones. I have advised him to stop iron replacement. He might benefit from folic acid supplementation 1 mg daily. There is no indication for phlebotomy. He may need additional special testing: Osmotic fragility testing and if this is negative, red cell enzyme analysis to include G6PD, and pyruvate kinase. Other enzymes as indicated by these results.  Levert Feinstein, MD 06/23/2016, 6:06 PM

## 2016-06-23 NOTE — Patient Instructions (Signed)
To lab today Schedule ultrasound of abdomen  Return visit 3-4 weeks (after results of Ultrasound available)

## 2016-06-24 ENCOUNTER — Encounter: Payer: Self-pay | Admitting: Oncology

## 2016-06-24 DIAGNOSIS — D6489 Other specified anemias: Secondary | ICD-10-CM

## 2016-06-24 DIAGNOSIS — D531 Other megaloblastic anemias, not elsewhere classified: Secondary | ICD-10-CM | POA: Insufficient documentation

## 2016-06-24 HISTORY — DX: Other specified anemias: D64.89

## 2016-06-24 LAB — CBC WITH DIFFERENTIAL/PLATELET
BASOS ABS: 0 10*3/uL (ref 0.0–0.2)
Basos: 0 %
EOS (ABSOLUTE): 0.1 10*3/uL (ref 0.0–0.4)
Eos: 1 %
HEMOGLOBIN: 9.7 g/dL — AB (ref 12.6–17.7)
Hematocrit: 28.8 % — ABNORMAL LOW (ref 37.5–51.0)
Immature Grans (Abs): 0 10*3/uL (ref 0.0–0.1)
Immature Granulocytes: 1 %
LYMPHS ABS: 0.9 10*3/uL (ref 0.7–3.1)
Lymphs: 11 %
MCH: 30.2 pg (ref 26.6–33.0)
MCHC: 33.7 g/dL (ref 31.5–35.7)
MCV: 90 fL (ref 79–97)
MONOCYTES: 5 %
MONOS ABS: 0.4 10*3/uL (ref 0.1–0.9)
Neutrophils Absolute: 6.3 10*3/uL (ref 1.4–7.0)
Neutrophils: 82 %
PLATELETS: 141 10*3/uL — AB (ref 150–379)
RBC: 3.21 x10E6/uL — AB (ref 4.14–5.80)
RDW: 17.1 % — AB (ref 12.3–15.4)
WBC: 7.6 10*3/uL (ref 3.4–10.8)

## 2016-06-24 LAB — LACTATE DEHYDROGENASE: LDH: 155 IU/L (ref 121–224)

## 2016-06-24 LAB — RETICULOCYTES: RETIC CT PCT: 5.4 % — AB (ref 0.6–2.6)

## 2016-06-24 LAB — GAMMA GT: GGT: 16 IU/L (ref 0–65)

## 2016-06-24 LAB — ERYTHROPOIETIN: Erythropoietin: 24.1 m[IU]/mL — ABNORMAL HIGH (ref 2.6–18.5)

## 2016-07-04 ENCOUNTER — Ambulatory Visit (HOSPITAL_COMMUNITY)
Admission: RE | Admit: 2016-07-04 | Discharge: 2016-07-04 | Disposition: A | Discharge status: Home / Self Care | Payer: Medicare Other | Source: Ambulatory Visit | Attending: Oncology | Admitting: Oncology

## 2016-07-04 DIAGNOSIS — R161 Splenomegaly, not elsewhere classified: Secondary | ICD-10-CM | POA: Insufficient documentation

## 2016-07-04 DIAGNOSIS — D649 Anemia, unspecified: Secondary | ICD-10-CM | POA: Diagnosis not present

## 2016-07-04 DIAGNOSIS — Z9049 Acquired absence of other specified parts of digestive tract: Secondary | ICD-10-CM | POA: Insufficient documentation

## 2016-07-07 ENCOUNTER — Encounter: Payer: Self-pay | Admitting: Oncology

## 2016-07-07 ENCOUNTER — Ambulatory Visit (INDEPENDENT_AMBULATORY_CARE_PROVIDER_SITE_OTHER): Payer: Medicare Other | Admitting: Oncology

## 2016-07-07 VITALS — BP 135/39 | HR 50 | Temp 98.2°F | Ht 69.5 in | Wt 201.5 lb

## 2016-07-07 DIAGNOSIS — D580 Hereditary spherocytosis: Secondary | ICD-10-CM

## 2016-07-07 DIAGNOSIS — D6489 Other specified anemias: Secondary | ICD-10-CM

## 2016-07-07 DIAGNOSIS — Z832 Family history of diseases of the blood and blood-forming organs and certain disorders involving the immune mechanism: Secondary | ICD-10-CM

## 2016-07-07 DIAGNOSIS — D531 Other megaloblastic anemias, not elsewhere classified: Secondary | ICD-10-CM | POA: Diagnosis not present

## 2016-07-07 HISTORY — DX: Hereditary spherocytosis: D58.0

## 2016-07-07 NOTE — Patient Instructions (Addendum)
We will check into re-embursement for Jadenu (deferasirox): medication to bind iron If we get approval, we will start with 2 360 mg tablets together in the morning before breakfast Please schedule an eye exam to include a slit lamp examination  Also - schedule a hearing test Once you get started, we will check blood work  4 weeks later and have you come in for a visit to make sure you are not getting any side effects We are going to start at a low dose MD vist: schedule for 1st week in October

## 2016-07-07 NOTE — Progress Notes (Signed)
Hematology and Oncology Follow Up Visit  Nilsa NuttingWilliam Resendes [id number] [birth year] 70 y.o. 07/07/2016 4:07 PM   Principle Diagnosis: Encounter Diagnoses  Name Primary?  . Familial anemia Yes  . Hemochromatosis   . 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 However, this turned out to be the tip of the iceberg. He gave a history of a familial anemia on his mother's side of the family. Looking back at previous lab work, this appeared to be a hemolytic anemia with reticulocyte counts recorded as high as 15%. Lab done on the day of his visit 06/16/2016 showed elevated total bilirubin of 2.0. Otherwise normal liver functions. Hemoglobin 10, hematocrit 29, MCV 89,  MCHC 34.4 upper normal 35.7, Serum iron 115 saturation 42%, ferritin 642. Subsequent reticulocyte count 5.4% and LDH 155 done on July 31. I reviewed the peripheral blood film. I observed 1+ spherocytes. He had a history of a cholecystectomy. I guessed that these were bilirubin stones. This was not specifically stated in the surgical report. I guessed that his blood disorder was hereditary spherocytosis. I was able to confirm this by a call to his primary care physician. I did not feel the spleen on my exam but I went ahead and got an ultrasound to look at both his liver and spleen anticipating that he might have mild splenomegaly if ,in fact, he had hereditary serocytosis. The study was just done on August 11. There are no focal abnormalities of his liver. Spleen was borderline enlarged at 14 cm.  Medications: reviewed  Allergies: No Known Allergies  Review of Systems: He admits to exertional dyspnea and chest discomfort.  Physical Exam: Blood pressure (!) 135/39, pulse (!) 50, temperature 98.2 F (36.8 C), temperature source Oral, height 5' 9.5" (1.765 m), weight 201 lb 8 oz (91.4 kg),  SpO2 100 %. Wt Readings from Last 3 Encounters:  07/07/16 201 lb 8 oz (91.4 kg)  06/23/16 202 lb (91.6 kg)  11/29/12 230 lb (104.3 kg)     Complete exam done last month not repeated today  Lab Results: CBC W/Diff    Component Value Date/Time   WBC 7.6 06/23/2016 1449   WBC 10.1 09/15/2007 1040   RBC 3.21 (L) 06/23/2016 1449   RBC 4.32 09/15/2007 1040   HGB 13.1 09/15/2007 1040   HCT 28.8 (L) 06/23/2016 1449   PLT 141 (L) 06/23/2016 1449   MCV 90 06/23/2016 1449   MCH 30.2 06/23/2016 1449   MCHC 33.7 06/23/2016 1449   MCHC  09/15/2007 1040    35.4 REPEATED TO VERIFY CORRECTED ON 10/22 AT 1109: PREVIOUSLY REPORTED AS 36.8   RDW 17.1 (H) 06/23/2016 1449   LYMPHSABS 0.9 06/23/2016 1449   MONOABS 0.4 09/15/2007 1040   EOSABS 0.1 06/23/2016 1449   BASOSABS 0.0 06/23/2016 1449     Chemistry      Component Value Date/Time   NA 140 06/16/2016 0857   K 4.9 06/16/2016 0857   CL 105 06/16/2016 0857   CO2 19 06/16/2016 0857   BUN 19 06/16/2016 0857   CREATININE 1.16 06/16/2016 0857      Component Value Date/Time   CALCIUM 9.0 06/16/2016 0857   ALKPHOS 53 06/16/2016 0857   AST 14 06/16/2016 0857   ALT 9 06/16/2016 0857   BILITOT 2.0 (H) 06/16/2016 0857       Radiological Studies: [place] Abdomen Complete  Result Date: 07/04/2016 CLINICAL DATA:  Elevated ferritin and bilirubin. EXAM: ABDOMEN ULTRASOUND COMPLETE COMPARISON:  CT 01/08/2005. FINDINGS: Gallbladder: Cholecystectomy. Common bile duct: Diameter: 4.6 mm Liver: No focal lesion identified. Within normal limits in parenchymal echogenicity. IVC: No abnormality visualized. Pancreas: Visualized portion unremarkable. Spleen: Spleen is enlarged at approximately 14 cm with a volume of 1092 cc Right Kidney: Length: 10.0 cm. Echogenicity within normal limits. No mass or hydronephrosis visualized. Left Kidney: Length: 10.5 cm. Echogenicity within normal limits. No mass or hydronephrosis visualized. Abdominal aorta: No aneurysm  visualized. Other findings: None. IMPRESSION: 1. Cholecystectomy. No biliary distention. No focal hepatic abnormality identified. 2. Splenomegaly. Electronically Signed   By: Maisie Fushomas  Register   On: 07/04/2016 11:08    Impression:  #1. 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 It is hard to know whether his ferritin level has plateaued at the current value or whether it will continue to rise over time. His hemolysis appears to be well compensated at this time. Chelation therapy is standard practice in thalassemia major and intermedia but we have less information in hereditary spherocytosis which is a much rarer condition.  I think it is a judgment call as to whether or not chelation therapy would benefit him. We discussed the risks versus benefits of a trial of Exjade/Jadenu (deferasirox), oral iron chelator. I would start at a low dose of 7 mg/kg and slowly titrate if the drug was tolerated and was successful in reducing his iron load. He is willing to try this. We will go ahead and see if we can get insurance approval. If he does start the drug, recommendation is to get a hearing test and a eye exam including slit lamp examination at baseline and then annually. We discussed potential for kidney and liver toxicity. This should be extremely low since his iron burden is not very high and he has baseline normal hepatic function and minimal renal dysfunction.  #2. 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   CC: Patient Care Team: Kaleen MaskWilson Oliver Elkins, MD as PCP - General (Family Medicine) Kaleen MaskWilson Oliver Elkins, MD as Referring Physician [facility](Family Medicine)   Levert FeinsteinJAMES M Montgomery Favor, MD 8/14/20174:07 PM

## 2016-08-11 ENCOUNTER — Other Ambulatory Visit (INDEPENDENT_AMBULATORY_CARE_PROVIDER_SITE_OTHER): Payer: Medicare Other

## 2016-08-11 DIAGNOSIS — D580 Hereditary spherocytosis: Secondary | ICD-10-CM | POA: Diagnosis not present

## 2016-08-11 DIAGNOSIS — D6489 Other specified anemias: Secondary | ICD-10-CM

## 2016-08-11 DIAGNOSIS — D531 Other megaloblastic anemias, not elsewhere classified: Secondary | ICD-10-CM

## 2016-08-12 LAB — COMPREHENSIVE METABOLIC PANEL
ALK PHOS: 60 IU/L (ref 39–117)
ALT: 9 IU/L (ref 0–44)
AST: 15 IU/L (ref 0–40)
Albumin/Globulin Ratio: 2.3 — ABNORMAL HIGH (ref 1.2–2.2)
Albumin: 5 g/dL — ABNORMAL HIGH (ref 3.5–4.8)
BUN/Creatinine Ratio: 18 (ref 10–24)
BUN: 34 mg/dL — ABNORMAL HIGH (ref 8–27)
Bilirubin Total: 2.6 mg/dL — ABNORMAL HIGH (ref 0.0–1.2)
CALCIUM: 9.1 mg/dL (ref 8.6–10.2)
CO2: 18 mmol/L (ref 18–29)
CREATININE: 1.91 mg/dL — AB (ref 0.76–1.27)
Chloride: 101 mmol/L (ref 96–106)
GFR calc Af Amer: 40 mL/min/{1.73_m2} — ABNORMAL LOW (ref >59)
GFR, EST NON AFRICAN AMERICAN: 35 mL/min/{1.73_m2} — AB (ref >59)
GLUCOSE: 154 mg/dL — AB (ref 65–99)
Globulin, Total: 2.2 g/dL (ref 1.5–4.5)
Potassium: 4.7 mmol/L (ref 3.5–5.2)
SODIUM: 138 mmol/L (ref 134–144)
Total Protein: 7.2 g/dL (ref 6.0–8.5)

## 2016-08-12 LAB — CBC WITH DIFFERENTIAL/PLATELET
BASOS ABS: 0 10*3/uL (ref 0.0–0.2)
Basos: 0 %
EOS (ABSOLUTE): 0.1 10*3/uL (ref 0.0–0.4)
EOS: 2 %
HEMATOCRIT: 27.3 % — AB (ref 37.5–51.0)
Hemoglobin: 9.2 g/dL — ABNORMAL LOW (ref 12.6–17.7)
IMMATURE GRANULOCYTES: 1 %
Immature Grans (Abs): 0.1 10*3/uL (ref 0.0–0.1)
LYMPHS ABS: 0.8 10*3/uL (ref 0.7–3.1)
Lymphs: 11 %
MCH: 30 pg (ref 26.6–33.0)
MCHC: 33.7 g/dL (ref 31.5–35.7)
MCV: 89 fL (ref 79–97)
MONOS ABS: 0.6 10*3/uL (ref 0.1–0.9)
Monocytes: 8 %
NEUTROS PCT: 78 %
Neutrophils Absolute: 5.7 10*3/uL (ref 1.4–7.0)
PLATELETS: 157 10*3/uL (ref 150–379)
RBC: 3.07 x10E6/uL — AB (ref 4.14–5.80)
RDW: 18.4 % — AB (ref 12.3–15.4)
WBC: 7.4 10*3/uL (ref 3.4–10.8)

## 2016-08-12 LAB — LACTATE DEHYDROGENASE: LDH: 173 IU/L (ref 121–224)

## 2016-08-12 LAB — RETICULOCYTES: RETIC CT PCT: 9.8 % — AB (ref 0.6–2.6)

## 2016-08-12 LAB — FERRITIN: FERRITIN: 809 ng/mL — AB (ref 30–400)

## 2016-08-13 ENCOUNTER — Telehealth: Payer: Self-pay | Admitting: *Deleted

## 2016-08-13 NOTE — Telephone Encounter (Signed)
-----   Message from Levert FeinsteinJames M Granfortuna, MD sent at 08/12/2016  2:45 PM EDT ----- Call pt: he is getting dehydrated. He should call Dr Jeannetta NapElkins for advice on what to do with his water pills.  See if [place] can get him approved for Jadenu: iron overload due to hereditary spherocytosis

## 2016-08-13 NOTE — Telephone Encounter (Signed)
Pt called/informed "he is getting dehydrated. He should call Dr Jeannetta NapElkins for advice on what to do with his water pills." per Dr Cyndie ChimeGranfortuna. Stated Dr Donnie Ahoilley prescribed Hygroton and Aldactone ; instructed to call his office today; voiced understanding. Also stated he has not been feeling very well.

## 2016-08-15 ENCOUNTER — Telehealth: Payer: Self-pay | Admitting: *Deleted

## 2016-08-15 NOTE — Telephone Encounter (Signed)
Info needed for cover my meds:  BIN: [id number] PCN: [id number] RX GROUP: [id number]  MEMBER ID: [id number]

## 2016-08-15 NOTE — Telephone Encounter (Signed)
Attempted to submit PA request online via Cover My Meds-will request tablet strength and directions for use from ordering MD.Coretha Creswell Cassady9/22/201711:28 AM

## 2016-08-25 ENCOUNTER — Encounter: Payer: Self-pay | Admitting: Oncology

## 2016-08-25 ENCOUNTER — Ambulatory Visit (INDEPENDENT_AMBULATORY_CARE_PROVIDER_SITE_OTHER): Payer: Medicare Other | Admitting: Oncology

## 2016-08-25 VITALS — BP 124/38 | HR 48 | Temp 97.4°F | Ht 69.5 in | Wt 191.8 lb

## 2016-08-25 DIAGNOSIS — D580 Hereditary spherocytosis: Secondary | ICD-10-CM | POA: Diagnosis not present

## 2016-08-25 DIAGNOSIS — D591 Other autoimmune hemolytic anemias: Secondary | ICD-10-CM | POA: Diagnosis not present

## 2016-08-25 NOTE — Progress Notes (Signed)
Hematology and Oncology Follow Up Visit  Mario NuttingWilliam Foster [id number] 24-Mar-1946 70 y.o. 08/25/2016 2:23 PM   Principle Diagnosis: Encounter Diagnoses  Name Primary?  . Hereditary spherocytosis (HCC) Yes  . 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 However, this turned out to be the tip of the iceberg. He gave a history of a familial anemia on his mother's side of the family. Looking back at previous lab work, this appeared to be a hemolytic anemia with reticulocyte counts recorded as high as 15%. Lab done on the day of his visit 06/16/2016 showed elevated total bilirubin of 2.0. Otherwise normal liver functions. Hemoglobin 10, hematocrit 29, MCV 89,  MCHC 34.4 upper normal 35.7, Serum iron 115 saturation 42%, ferritin 642. Subsequent reticulocyte count 5.4% and LDH 155 done on July 31. I reviewed the peripheral blood film. I observed 1+ spherocytes. He had a history of a cholecystectomy. I guessed that these were bilirubin stones. This was not specifically stated in the surgical report. I guessed that his blood disorder was hereditary spherocytosis. I was able to confirm this by a call to his primary care physician. I did not feel the spleen on my exam but I went ahead and got an ultrasound to look at both his liver and spleen anticipating that he might have mild splenomegaly if ,in fact, he had hereditary serocytosis. The study was just done on August 11. There are no focal abnormalities of his liver. Spleen was borderline enlarged at 14 cm. At the time of his visit here in August, we discussed the possibility of going on chelation therapy to lower his iron stores. This is a very common practice for the hemolytic anemia associated with thalassemia and also sickle cell anemia. Due to the rarity of hereditary spherocytosis, I could not find much information about chelation therapy for this. I  gave him information to read about the drug while insurance inquiries were in progress to see if he would be reimbursed.   Interim History:   Clinically stable since last visit with no new medical issues. Repeat lab done on September 18 shows hemoglobin overall stable at 9.2 g, reticulocytes elevated at 10%, bilirubin 2.6. Ferritin further increase 809.  Medications: reviewed  Allergies: No Known Allergies  Review of Systems: See interim history Remaining ROS negative:   Physical Exam: Blood pressure (!) 124/38, pulse (!) 48, temperature 97.4 F (36.3 C), temperature source Oral, height 5' 9.5" (1.765 m), weight 191 lb 12.8 oz (87 kg), SpO2 100 %. Wt Readings from Last 3 Encounters:  08/25/16 191 lb 12.8 oz (87 kg)  07/07/16 201 lb 8 oz (91.4 kg)  06/23/16 202 lb (91.6 kg)     General appearance: Well nourished Caucasian man Focused exam today limited to the abdomen. I still cannot feel his spleen even in the left lateral decubitus position. Spleen is borderline enlarged by ultrasound criteria as noted above. Lab Results: CBC W/Diff    Component Value Date/Time   WBC 7.4 08/11/2016 0920   WBC 10.1 09/15/2007 1040   RBC 3.07 (L) 08/11/2016 0920   RBC 4.32 09/15/2007 1040   HGB 13.1 09/15/2007 1040   HCT 27.3 (L) 08/11/2016 0920   PLT 157 08/11/2016 0920   MCV 89 08/11/2016 0920   MCH 30.0 08/11/2016 0920   MCHC 33.7 08/11/2016 0920   MCHC  09/15/2007 1040    35.4 REPEATED TO VERIFY CORRECTED ON 10/22 AT 1109:  PREVIOUSLY REPORTED AS 36.8   RDW 18.4 (H) 08/11/2016 0920   LYMPHSABS 0.8 08/11/2016 0920   MONOABS 0.4 09/15/2007 1040   EOSABS 0.1 08/11/2016 0920   BASOSABS 0.0 08/11/2016 0920     Chemistry      Component Value Date/Time   NA 138 08/11/2016 0920   K 4.7 08/11/2016 0920   CL 101 08/11/2016 0920   CO2 18 08/11/2016 0920   BUN 34 (H) 08/11/2016 0920   CREATININE 1.91 (H) 08/11/2016 0920      Component Value Date/Time   CALCIUM 9.1 08/11/2016 0920    ALKPHOS 60 08/11/2016 0920   AST 15 08/11/2016 0920   ALT 9 08/11/2016 0920   BILITOT 2.6 (H) 08/11/2016 0920       Radiological Studies: No results found.  Impression:  #1. 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 Iron overload secondary to #1 He read the package insert on Jadenu (deferasirox) oral iron chelator and he is apprehensive about going on the drug due to potential side effects. I told him it would not be absolutely necessary. There is a paucity of data to draw conclusions from. However, there is a concern that if his ferritin continues to rise, that it might impact on his heart and liver function. We are still waiting on the insurance approval. I told him that I would reevaluate him after the holidays.  #3. Carrier for the minor hemochromatosis genes which, by itself, would not warrant phlebotomy.  CC: Patient Care Team: Kaleen Mask, MD as PCP - General (Family Medicine) Kaleen Mask, MD as Referring Physician (Family Medicine)   Levert Feinstein, MD 10/2/20172:23 PM

## 2016-08-25 NOTE — Patient Instructions (Signed)
Come back for lab work on Nov 25, 2016 MD visit 1-2 weeks after lab

## 2016-12-16 ENCOUNTER — Telehealth: Payer: Self-pay | Admitting: Family Medicine

## 2016-12-16 NOTE — Telephone Encounter (Signed)
APT. REMINDER CALL, LMTCB °

## 2016-12-17 ENCOUNTER — Other Ambulatory Visit (INDEPENDENT_AMBULATORY_CARE_PROVIDER_SITE_OTHER): Payer: Medicare Other

## 2016-12-17 DIAGNOSIS — D580 Hereditary spherocytosis: Secondary | ICD-10-CM | POA: Diagnosis not present

## 2016-12-18 LAB — COMPREHENSIVE METABOLIC PANEL
ALBUMIN: 4.6 g/dL (ref 3.5–4.8)
ALT: 15 IU/L (ref 0–44)
AST: 16 IU/L (ref 0–40)
Albumin/Globulin Ratio: 2.1 (ref 1.2–2.2)
Alkaline Phosphatase: 59 IU/L (ref 39–117)
BILIRUBIN TOTAL: 2.2 mg/dL — AB (ref 0.0–1.2)
BUN / CREAT RATIO: 17 (ref 10–24)
BUN: 20 mg/dL (ref 8–27)
CALCIUM: 8.7 mg/dL (ref 8.6–10.2)
CO2: 18 mmol/L (ref 18–29)
CREATININE: 1.2 mg/dL (ref 0.76–1.27)
Chloride: 101 mmol/L (ref 96–106)
GFR calc non Af Amer: 60 mL/min/{1.73_m2} (ref >59)
GFR, EST AFRICAN AMERICAN: 70 mL/min/{1.73_m2} (ref >59)
GLUCOSE: 309 mg/dL — AB (ref 65–99)
Globulin, Total: 2.2 g/dL (ref 1.5–4.5)
Potassium: 5.2 mmol/L (ref 3.5–5.2)
Sodium: 137 mmol/L (ref 134–144)
TOTAL PROTEIN: 6.8 g/dL (ref 6.0–8.5)

## 2016-12-18 LAB — CBC WITH DIFFERENTIAL/PLATELET
Basophils Absolute: 0 10*3/uL (ref 0.0–0.2)
Basos: 0 %
EOS (ABSOLUTE): 0.1 10*3/uL (ref 0.0–0.4)
Eos: 2 %
HEMATOCRIT: 28.9 % — AB (ref 37.5–51.0)
Hemoglobin: 10 g/dL — ABNORMAL LOW (ref 13.0–17.7)
IMMATURE GRANS (ABS): 0.1 10*3/uL (ref 0.0–0.1)
Immature Granulocytes: 1 %
LYMPHS ABS: 1.1 10*3/uL (ref 0.7–3.1)
Lymphs: 17 %
MCH: 31.6 pg (ref 26.6–33.0)
MCHC: 34.6 g/dL (ref 31.5–35.7)
MCV: 92 fL (ref 79–97)
MONOCYTES: 7 %
Monocytes Absolute: 0.5 10*3/uL (ref 0.1–0.9)
NEUTROS ABS: 4.8 10*3/uL (ref 1.4–7.0)
Neutrophils: 73 %
Platelets: 138 10*3/uL — ABNORMAL LOW (ref 150–379)
RBC: 3.16 x10E6/uL — AB (ref 4.14–5.80)
RDW: 16.1 % — ABNORMAL HIGH (ref 12.3–15.4)
WBC: 6.6 10*3/uL (ref 3.4–10.8)

## 2016-12-18 LAB — FERRITIN: Ferritin: 627 ng/mL — ABNORMAL HIGH (ref 30–400)

## 2016-12-18 LAB — RETICULOCYTES: Retic Ct Pct: 7 % — ABNORMAL HIGH (ref 0.6–2.6)

## 2016-12-30 ENCOUNTER — Ambulatory Visit (INDEPENDENT_AMBULATORY_CARE_PROVIDER_SITE_OTHER): Payer: Medicare Other | Admitting: Oncology

## 2016-12-30 ENCOUNTER — Encounter: Payer: Self-pay | Admitting: Oncology

## 2016-12-30 VITALS — BP 128/35 | HR 52 | Temp 98.0°F | Ht 69.5 in | Wt 199.7 lb

## 2016-12-30 DIAGNOSIS — N183 Chronic kidney disease, stage 3 unspecified: Secondary | ICD-10-CM

## 2016-12-30 DIAGNOSIS — I129 Hypertensive chronic kidney disease with stage 1 through stage 4 chronic kidney disease, or unspecified chronic kidney disease: Secondary | ICD-10-CM

## 2016-12-30 DIAGNOSIS — E1122 Type 2 diabetes mellitus with diabetic chronic kidney disease: Secondary | ICD-10-CM

## 2016-12-30 DIAGNOSIS — D580 Hereditary spherocytosis: Secondary | ICD-10-CM

## 2016-12-30 DIAGNOSIS — D631 Anemia in chronic kidney disease: Secondary | ICD-10-CM | POA: Diagnosis not present

## 2016-12-30 DIAGNOSIS — Z794 Long term (current) use of insulin: Secondary | ICD-10-CM

## 2016-12-30 NOTE — Progress Notes (Signed)
Hematology and Oncology Follow Up Visit  Nilsa NuttingWilliam Egelhoff [id number] 1946/09/17 71 y.o. 12/30/2016 10:05 AM   Principle Diagnosis: Encounter Diagnoses  Name Primary?  . Hereditary spherocytosis (HCC) Yes  . Hereditary hemochromatosis (HCC)   . 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 However, this turned out to be the tip of the iceberg. 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 Looking back at previous lab work, this appeared to be a hemolytic anemia with reticulocyte counts recorded as high as 15%. Lab done on the day of his visit 06/16/2016 showed elevated total bilirubin of 2.0. Otherwise normal liver functions. Hemoglobin 10, hematocrit 29, MCV 89,  MCHC 34.4 upper normal 35.7, Serum iron 115 saturation 42%, ferritin 642. Subsequent reticulocyte count 5.4% and LDH 155 done on July 31. I reviewed the peripheral blood film. I observed 1+ spherocytes. He had a history of a cholecystectomy. I suspected that these were bilirubin stones. This was not specifically stated in the Pathology report. I guessed that his blood disorder was hereditary spherocytosis. I was able to confirm this by a call to his primary care physician and subsequent positive osmotic fragility test. I did not feel the spleen on my exam but I went ahead and got an ultrasound to look at both his liver and spleen anticipating that he might have mild splenomegaly if ,in fact, he had hereditary serocytosis. The study was done on July 04, 2016. There were no focal abnormalities of his liver. Spleen was borderline enlarged at 14 cm. Highest ferritin level recorded on 08/11/2016 was 809.  Interim History:  I considered putting him on a chelating agent, desferasirox  (Jadenu). I could not find good literature support for this so I queried national experts on HS online and they in fact would not routinely chelate someone with HS.  The patient was initially felt to have iron deficiency anemia and was taking iron supplements. Subsequent to his visit with me, they were discontinued. Lab in anticipation of today's vision on 12/17/2016 shows ferritin down to 627. There has been a coincidental improvement in his renal function from peak creatinine 1.9 in September 2017 to current value of 1.2. Bilirubin remains stable and mildly elevated due to chronic hemolysis at 2.2. Peak values as high as 3.2 recorded in the past. Retic count chronically elevated up to 10% with current value 7% on January 24. Hemoglobin running 9-10 g.  Medications: reviewed  Allergies: No Known Allergies  Review of Systems: No new complaints today. Energy level remains low. He is not participating in his usual activities such as hunting for this reason.  Remaining ROS negative:   Physical Exam: Blood pressure (!) 128/35, pulse (!) 52, temperature 98 F (36.7 C), temperature source Oral, height 5' 9.5" (1.765 m), weight 199 lb 11.2 oz (90.6 kg), SpO2 100 %. Wt Readings from Last 3 Encounters:  12/30/16 199 lb 11.2 oz (90.6 kg)  08/25/16 191 lb 12.8 oz (87 kg)  07/07/16 201 lb 8 oz (91.4 kg)     General appearance: Well-nourished Caucasian man HENNT: Pharynx no erythema, exudate, mass, or ulcer. No thyromegaly or thyroid nodules Lymph nodes: No cervical, supraclavicular, or axillary lymphadenopathy Breasts:  Lungs: Clear to auscultation, resonant to percussion throughout Heart: Regular rhythm, no murmur, no  gallop, no rub, no click, no edema Abdomen: Soft, nontender, normal bowel sounds, no mass, Spleen tip palpable in the left upper quadrant Extremities: No edema, no calf tenderness Musculoskeletal: no joint deformities GU:  Vascular: Carotid pulses 2+, no bruits,  Neurologic: Alert,  oriented, PERRLA, cranial nerves grossly normal, motor strength 5 over 5, reflexes 1+ symmetric, upper body coordination normal, gait normal, Skin: No rash or ecchymosis  Lab Results: CBC W/Diff    Component Value Date/Time   WBC 6.6 12/17/2016 1013   WBC 10.1 09/15/2007 1040   RBC 3.16 (L) 12/17/2016 1013   RBC 4.32 09/15/2007 1040   HGB 13.1 09/15/2007 1040   HCT 28.9 (L) 12/17/2016 1013   PLT 138 (L) 12/17/2016 1013   MCV 92 12/17/2016 1013   MCH 31.6 12/17/2016 1013   MCHC 34.6 12/17/2016 1013   MCHC  09/15/2007 1040    35.4 REPEATED TO VERIFY CORRECTED ON 10/22 AT 1109: PREVIOUSLY REPORTED AS 36.8   RDW 16.1 (H) 12/17/2016 1013   LYMPHSABS 1.1 12/17/2016 1013   MONOABS 0.4 09/15/2007 1040   EOSABS 0.1 12/17/2016 1013   BASOSABS 0.0 12/17/2016 1013     Chemistry      Component Value Date/Time   NA 137 12/17/2016 1013   K 5.2 12/17/2016 1013   CL 101 12/17/2016 1013   CO2 18 12/17/2016 1013   BUN 20 12/17/2016 1013   CREATININE 1.20 12/17/2016 1013      Component Value Date/Time   CALCIUM 8.7 12/17/2016 1013   ALKPHOS 59 12/17/2016 1013   AST 16 12/17/2016 1013   ALT 15 12/17/2016 1013   BILITOT 2.2 (H) 12/17/2016 1013       Radiological Studies: No results found.  Impression:  #1. Hereditary spherocytosis with associated chronic hemolytic anemia and iron overload. Overall stable and well compensated. He will need to be evaluated at times of physiologic stress such as infection or surgery which are likely to cause an accelerated amount of hemolysis. Other than this, he is advised to stay off iron supplements since the iron from  hemolyzed red cells will be recycled to his bone marrow to make new blood. Extra iron will only serve to increase his ferritin.  #2. Anemia of chronic renal insufficiency Long-standing hypertension and insulin-dependent diabetes  #3. Mutations in the 2 minor hemochromatosis genes which I do not believe are responsible for his  elevated ferritin and requires no further action.  I'm happy to see him again on an as-needed basis. I did not schedule a formal follow-up visit at this time.  CC: Patient Care Team: Kaleen Mask, MD as PCP - General (Family Medicine) Kaleen Mask, MD as Referring Physician (Family Medicine)   Levert Feinstein, MD 2/6/201810:05 AM

## 2016-12-30 NOTE — Patient Instructions (Signed)
Return as needed

## 2017-08-24 DEATH — deceased

## 2018-03-27 IMAGING — US US ABDOMEN COMPLETE
1 series · 14 of 25 positions shown · non-contrast
Comparison: CT 01/08/2005.

CLINICAL DATA: Elevated ferritin and bilirubin.

EXAM:
ABDOMEN ULTRASOUND COMPLETE

[Series 1: us abdomen complete · 0.23mm/px · 14 of 62 slices shown]
[im 1/62]
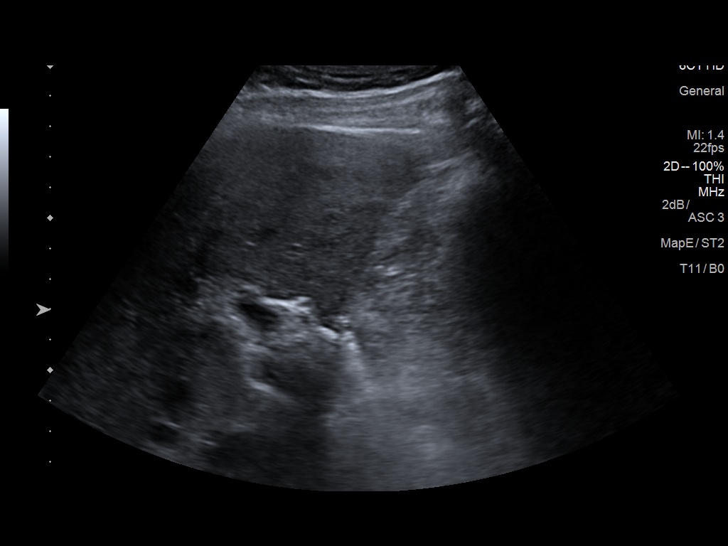
[im 6/62]
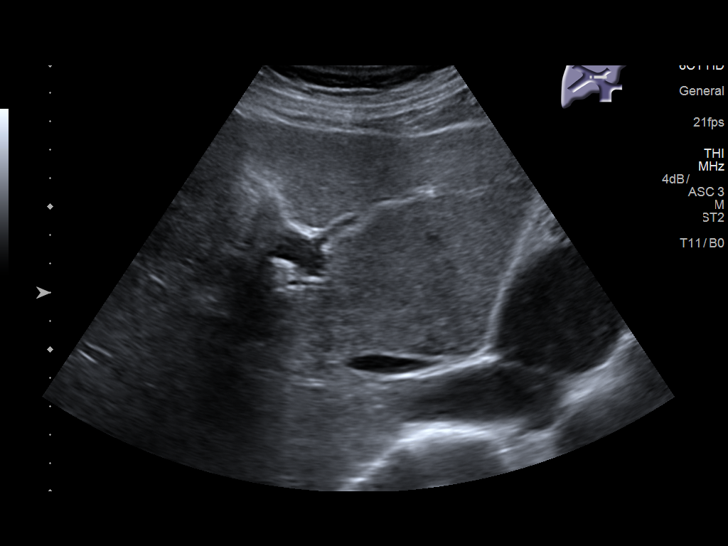
[im 11/62]
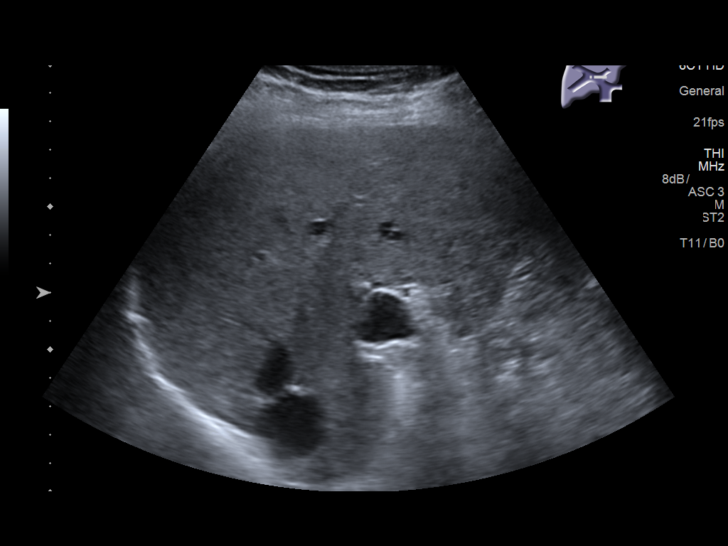
[im 16/62]
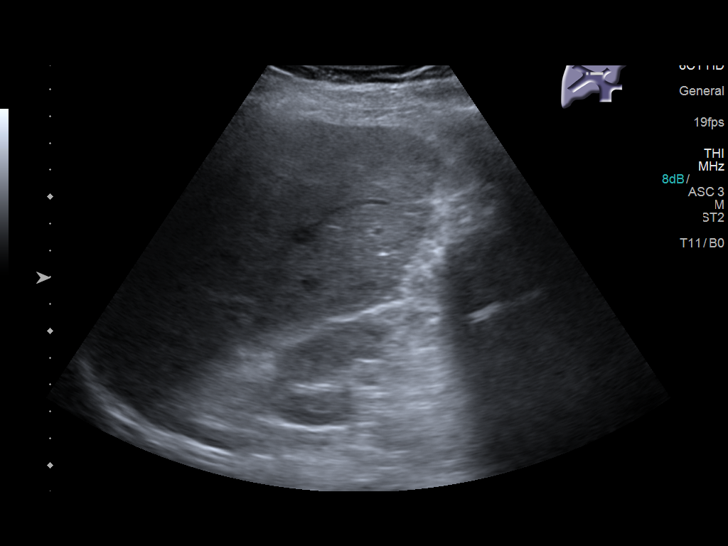
[im 21/62]
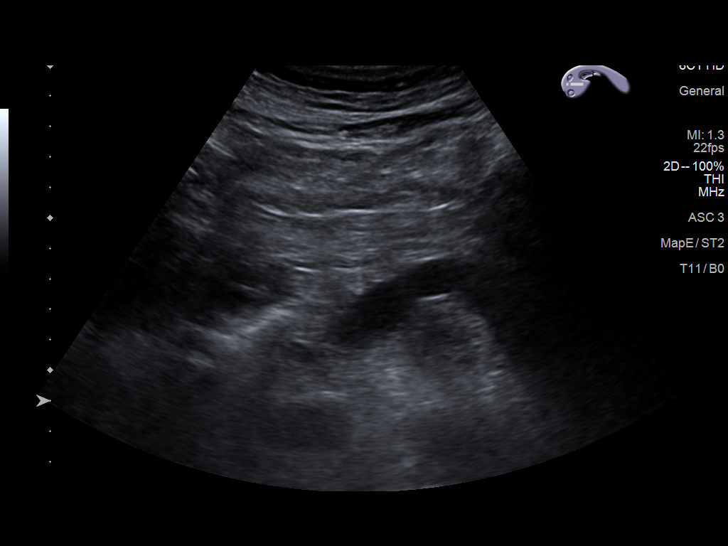
[im 23/62]
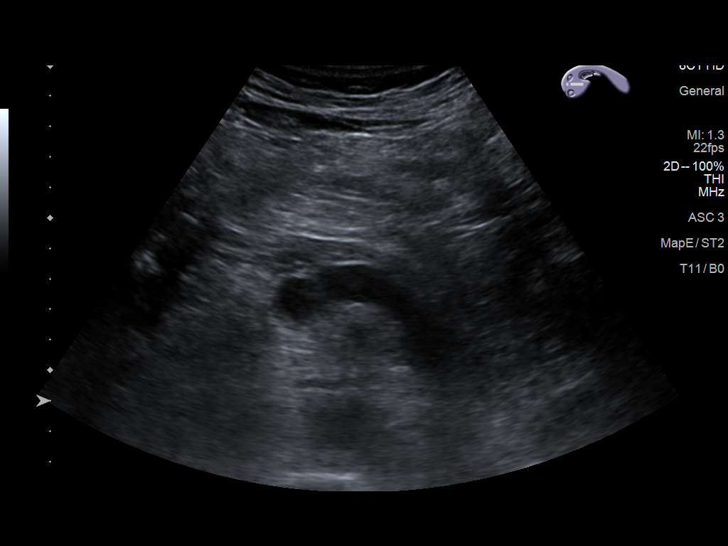
[im 28/62]
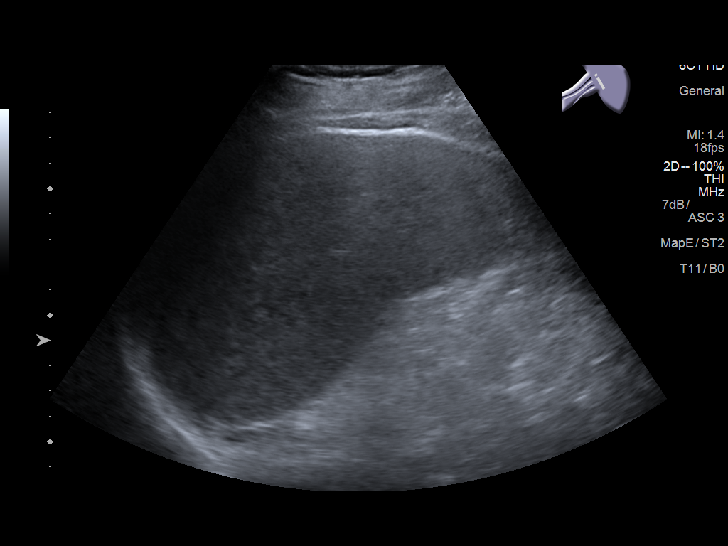
[im 34/62]
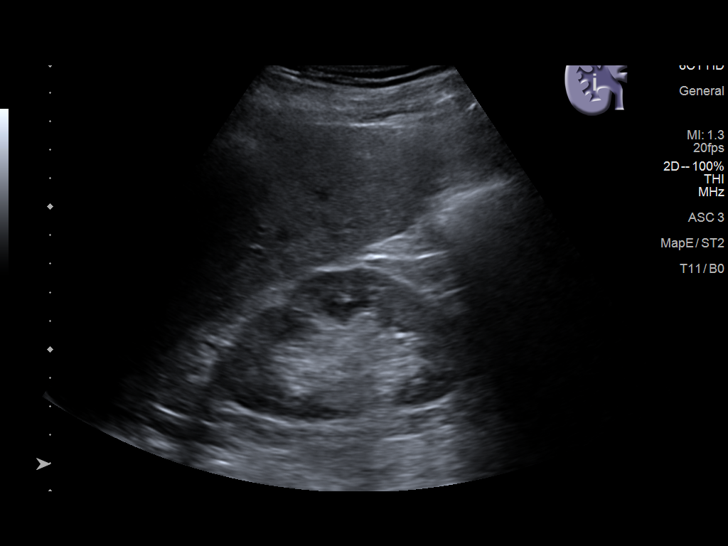
[im 39/62]
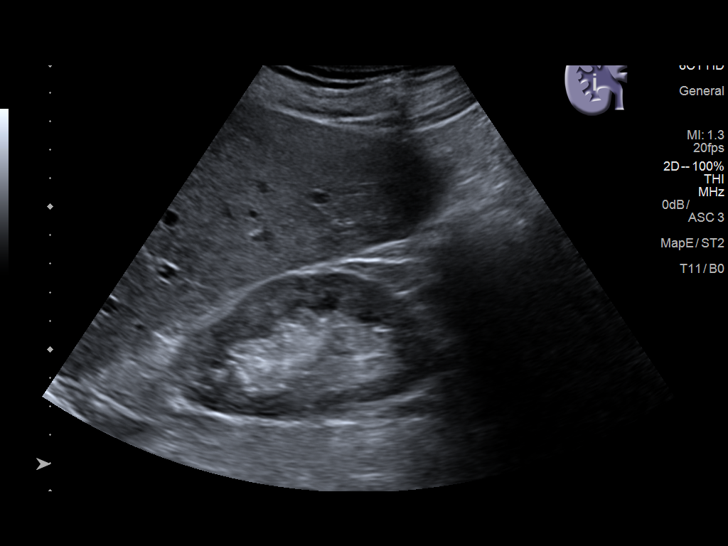
[im 41/62]
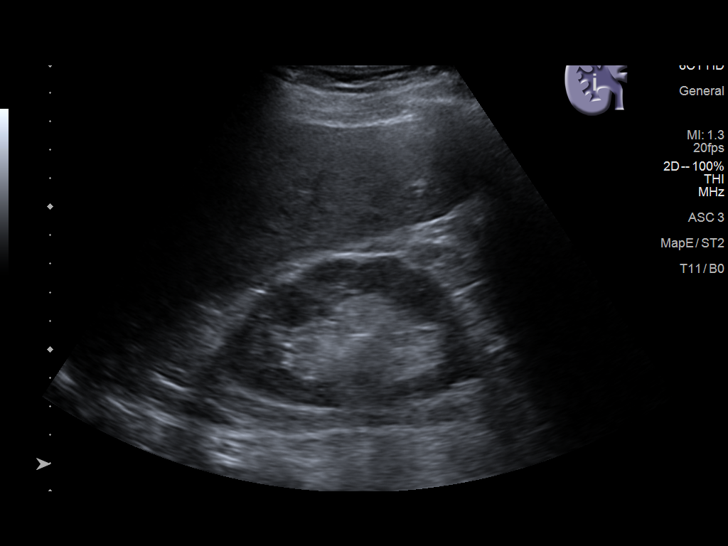
[im 46/62]
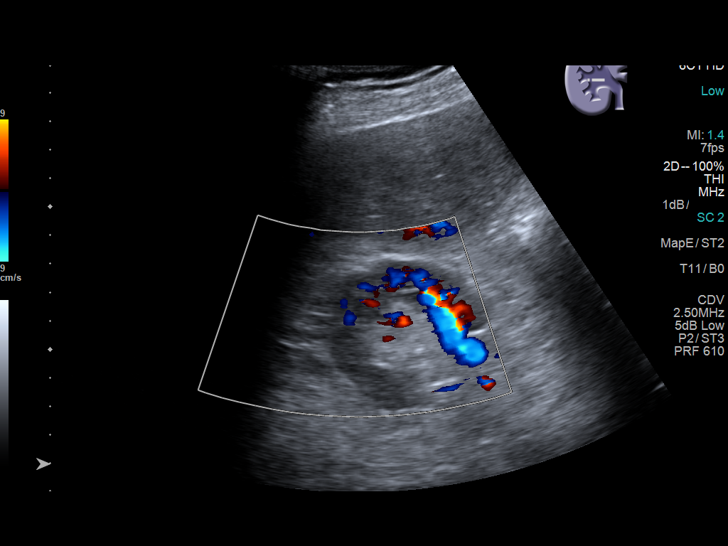
[im 51/62]
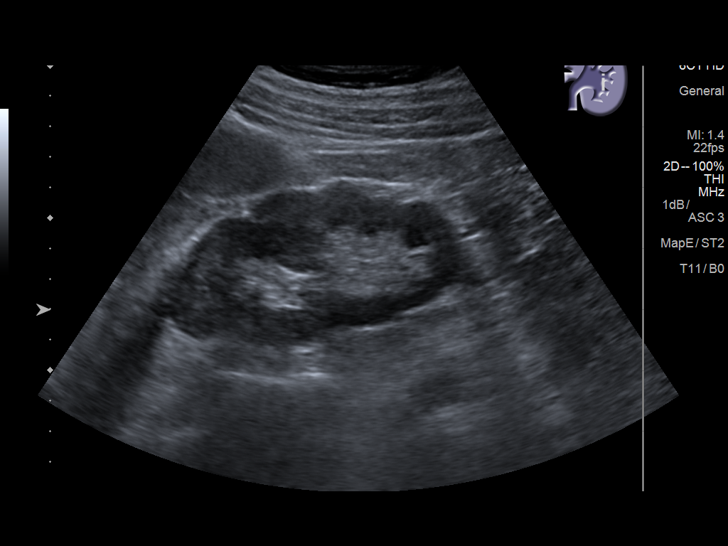
[im 56/62]
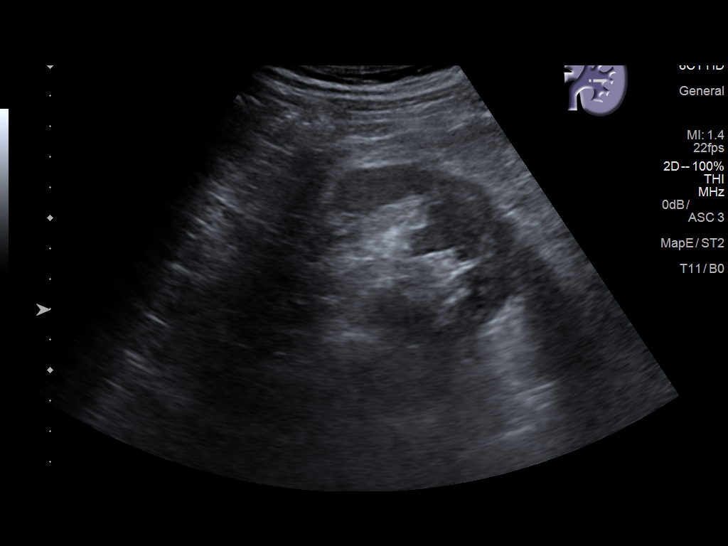
[im 62/62]
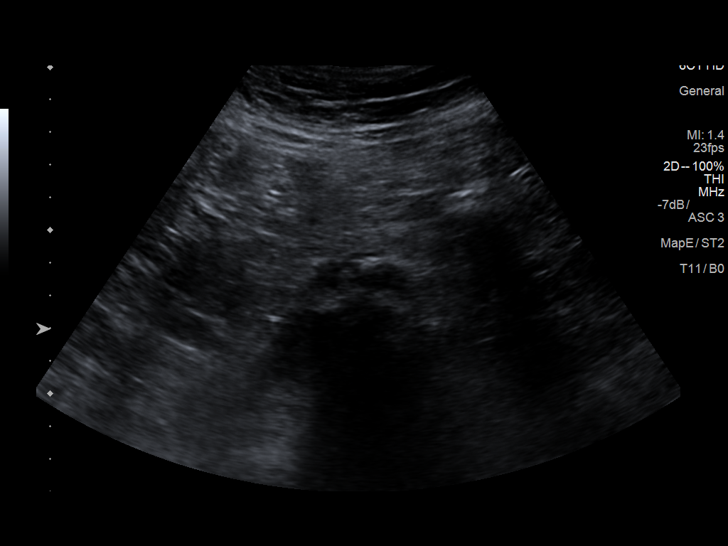

[14 of 25 positions shown; findings below may reference images not displayed]

FINDINGS: Gallbladder: Cholecystectomy.

Common bile duct: Diameter: 4.6 mm

Liver: No focal lesion identified. Within normal limits in
parenchymal echogenicity.

IVC: No abnormality visualized.

Pancreas: Visualized portion unremarkable.

Spleen: Spleen is enlarged at approximately 14 cm with a volume of
3582 cc

Right Kidney: Length: 10.0 cm. Echogenicity within normal limits. No
mass or hydronephrosis visualized.

Left Kidney: Length: 10.5 cm. Echogenicity within normal limits. No
mass or hydronephrosis visualized.

Abdominal aorta: No aneurysm visualized.

Other findings: None.
IMPRESSION: 1. Cholecystectomy. No biliary distention. No focal hepatic
abnormality identified. 2. Splenomegaly.
